# Patient Record
Sex: Female | Born: 1955 | Race: White | Hispanic: No | Marital: Married | Smoking: Former smoker
Health system: Southern US, Community
[De-identification: ages and names within clinical notes are randomized; demographics above are authoritative.]

## PROBLEM LIST (undated history)

## (undated) DIAGNOSIS — K219 Gastro-esophageal reflux disease without esophagitis: Secondary | ICD-10-CM

## (undated) DIAGNOSIS — R42 Dizziness and giddiness: Secondary | ICD-10-CM

## (undated) DIAGNOSIS — G43909 Migraine, unspecified, not intractable, without status migrainosus: Secondary | ICD-10-CM

## (undated) DIAGNOSIS — K5792 Diverticulitis of intestine, part unspecified, without perforation or abscess without bleeding: Secondary | ICD-10-CM

## (undated) HISTORY — DX: Dizziness and giddiness: R42

## (undated) HISTORY — DX: Gastro-esophageal reflux disease without esophagitis: K21.9

## (undated) HISTORY — PX: TUBAL LIGATION: SHX77

## (undated) HISTORY — PX: COLONOSCOPY: SHX174

## (undated) HISTORY — DX: Migraine, unspecified, not intractable, without status migrainosus: G43.909

## (undated) HISTORY — DX: Diverticulitis of intestine, part unspecified, without perforation or abscess without bleeding: K57.92

---

## 2003-08-27 LAB — HM PAP SMEAR

## 2003-08-28 LAB — HM MAMMOGRAPHY: HM Mammogram: NORMAL (ref 0–4)

## 2006-03-28 ENCOUNTER — Ambulatory Visit: Payer: Self-pay | Admitting: Gastroenterology

## 2006-05-15 ENCOUNTER — Ambulatory Visit: Payer: Self-pay | Admitting: Gastroenterology

## 2006-05-15 LAB — HM COLONOSCOPY

## 2007-05-05 IMAGING — CT CT ABD-PELV W/ CM
1 of 2 series · 15 of 32 positions shown, 19 images · non-contrast
Comparison: none

REASON FOR EXAM: LLQ pain abd pain  eval diverticulitis
COMMENTS:

[Series 2: abdomen · axial · 0.76mm/px · z∈[-82,+326]mm · 15 of 57 slices shown, 19 images]
[im 3/57  soft-tissue]
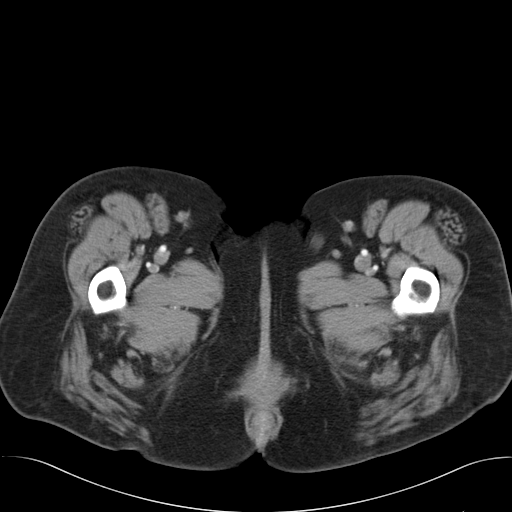
[im 3/57  bone]
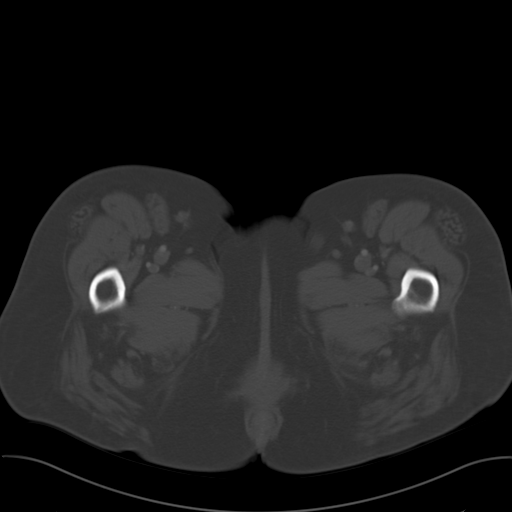
[im 8/57  soft-tissue]
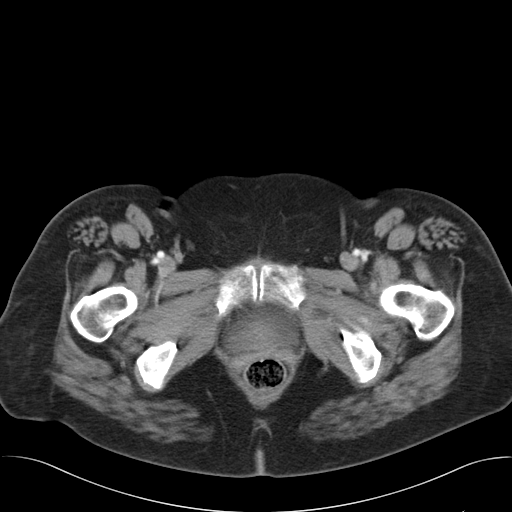
[im 12/57  soft-tissue]
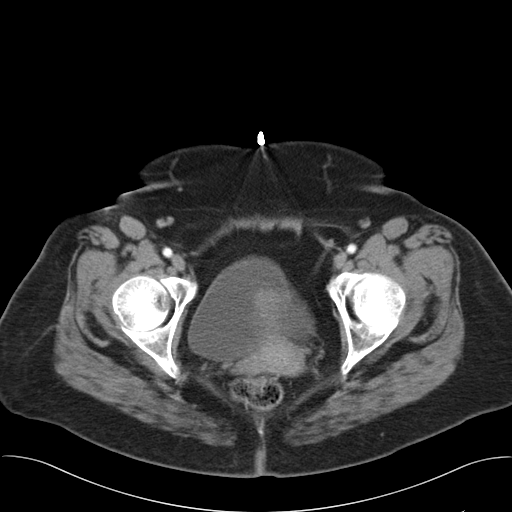
[im 17/57  soft-tissue]
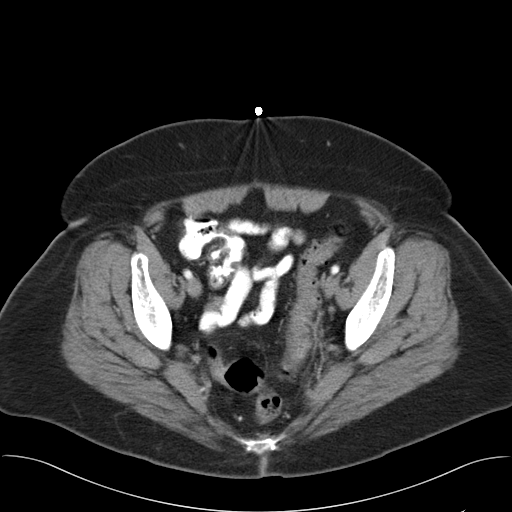
[im 19/57  soft-tissue]
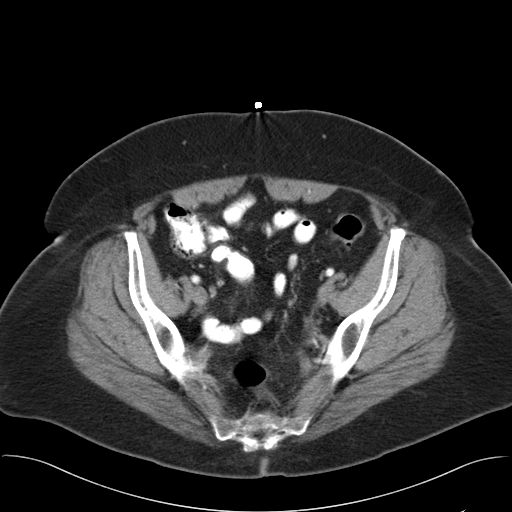
[im 24/57  soft-tissue]
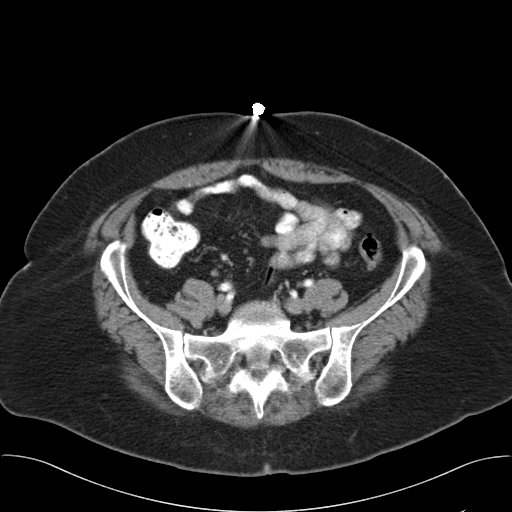
[im 29/57  soft-tissue]
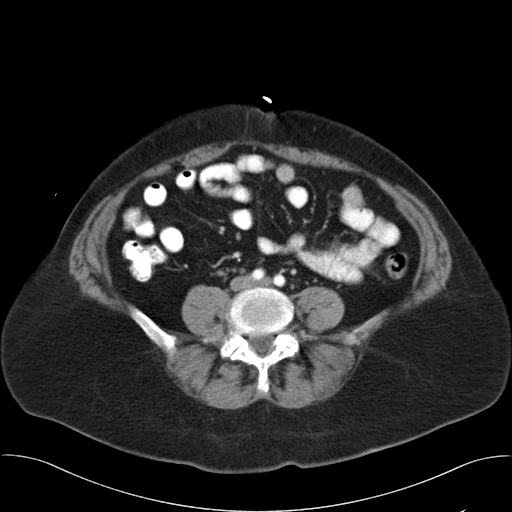
[im 33/57  soft-tissue]
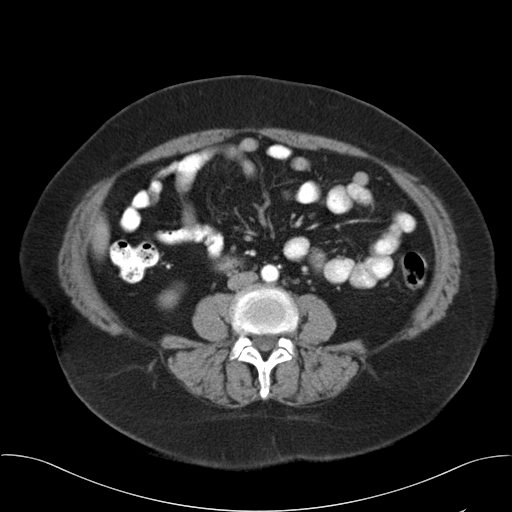
[im 38/57  soft-tissue]
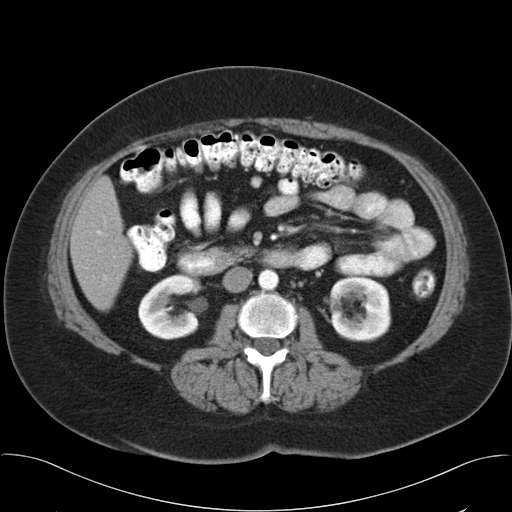
[im 38/57  bone]
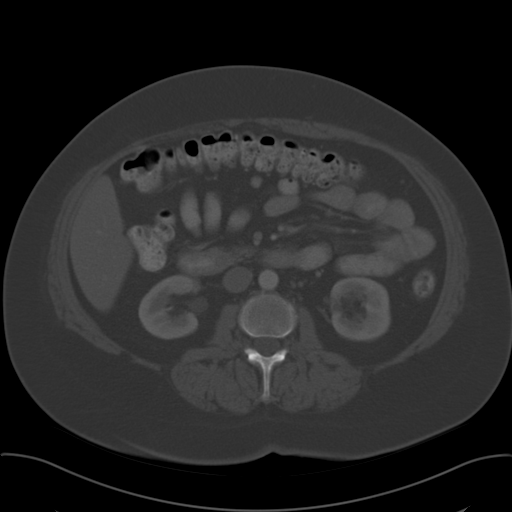
[im 40/57  soft-tissue]
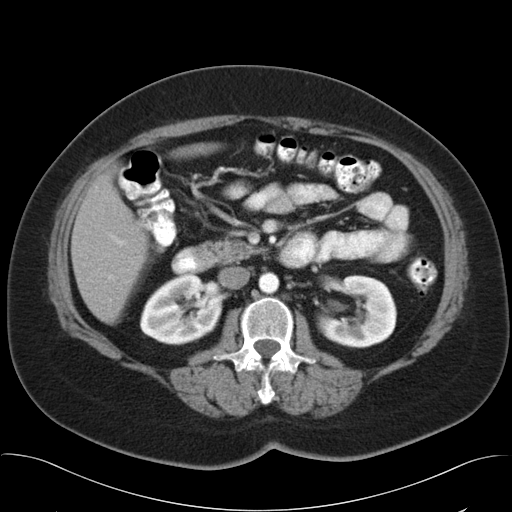
[im 45/57  soft-tissue]
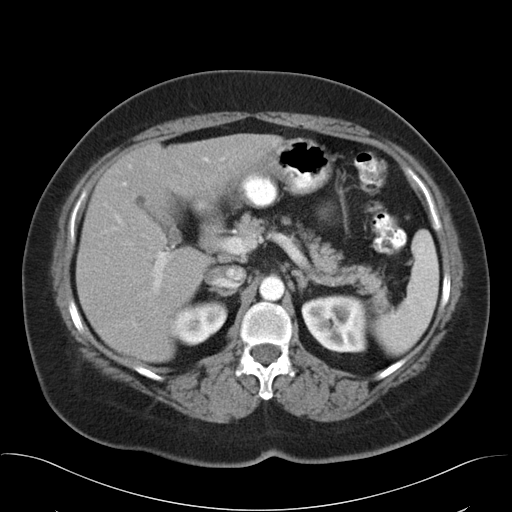
[im 47/57  lung]
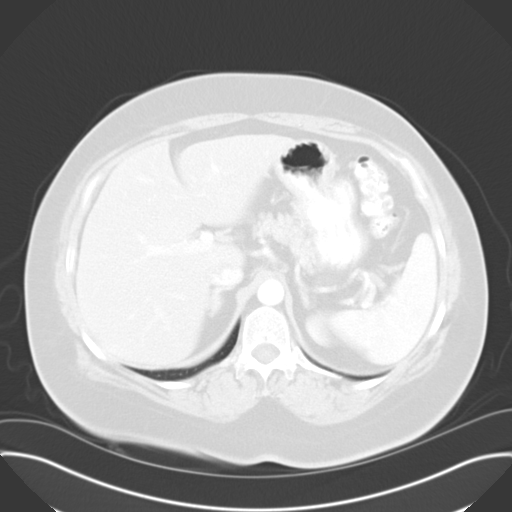
[im 50/57  soft-tissue]
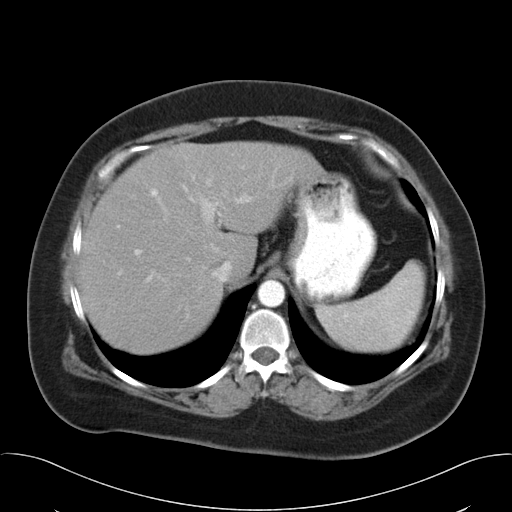
[im 50/57  lung]
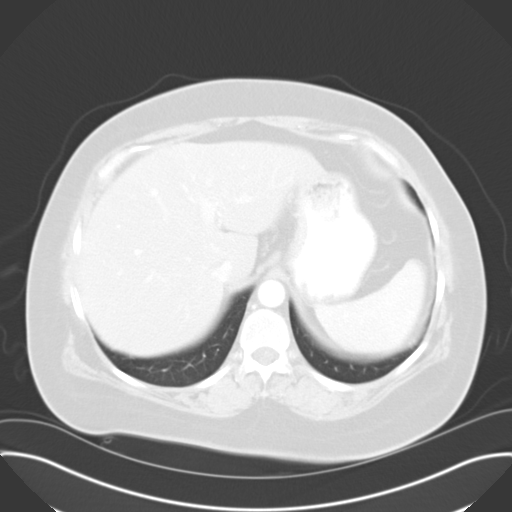
[im 52/57  lung]
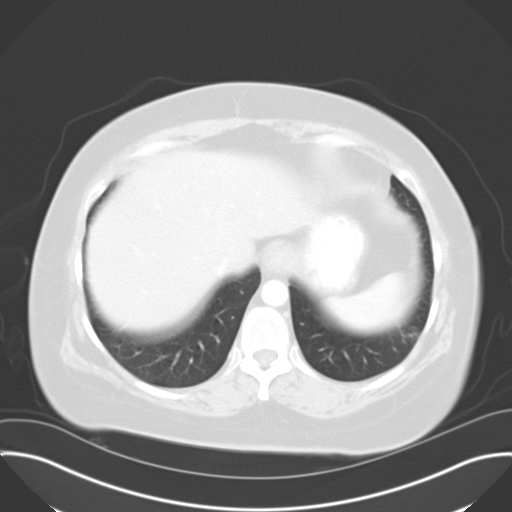
[im 54/57  soft-tissue]
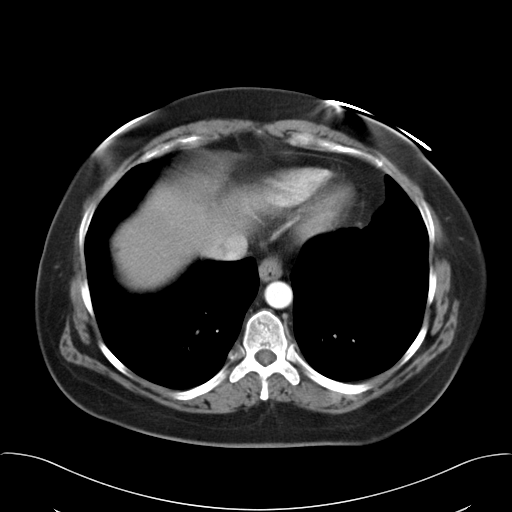
[im 54/57  lung]
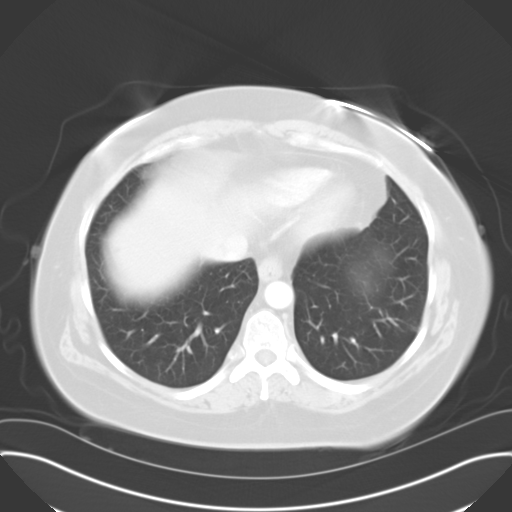

[15 of 32 positions shown; findings below may reference images not displayed]

PROCEDURE:     CT  - CT ABDOMEN / PELVIS  W  - March 28, 2006  [DATE]

RESULT:       IV and oral contrast enhanced CT scan of the Abdomen and
Pelvis is performed in the standard fashion.  The patient has no prior
studies available for comparison.  The patient is experiencing LEFT lower
quadrant pain.  There are some scattered diverticula seen especially in the
area of the junction of the sigmoid and descending colonic structures.
Discrete abscess formation is not evident.  The possibility of some very
mild diverticulitis cannot be excluded.  Again, there is no evidence of
abscess or free air.  No abnormal bowel distention is seen.  There is no
ascites.  The liver, spleen, gallbladder, pancreas, kidneys and adrenal
glands appear to be unremarkable.  The abdominal aorta is unremarkable.
IMPRESSION: CT findings of diverticulosis.  Very mild diverticulitis cannot be
completely excluded as there is very minimal stranding about the sigmoid
colonic region.  Clinical and laboratory correlation and follow up is
recommended.  The uterus is present.

## 2016-05-23 DIAGNOSIS — G43909 Migraine, unspecified, not intractable, without status migrainosus: Secondary | ICD-10-CM | POA: Insufficient documentation

## 2016-07-25 ENCOUNTER — Encounter: Payer: Self-pay | Admitting: Unknown Physician Specialty

## 2016-07-25 ENCOUNTER — Ambulatory Visit (INDEPENDENT_AMBULATORY_CARE_PROVIDER_SITE_OTHER): Payer: BLUE CROSS/BLUE SHIELD | Admitting: Unknown Physician Specialty

## 2016-07-25 VITALS — BP 117/80 | HR 78 | Temp 97.6°F | Ht 63.3 in | Wt 180.4 lb

## 2016-07-25 DIAGNOSIS — E669 Obesity, unspecified: Secondary | ICD-10-CM | POA: Diagnosis not present

## 2016-07-25 DIAGNOSIS — Z0000 Encounter for general adult medical examination without abnormal findings: Secondary | ICD-10-CM

## 2016-07-25 DIAGNOSIS — F17200 Nicotine dependence, unspecified, uncomplicated: Secondary | ICD-10-CM | POA: Diagnosis not present

## 2016-07-25 NOTE — Assessment & Plan Note (Addendum)
Pt is not interested in quitting at this time.  Recommended low dose CT

## 2016-07-25 NOTE — Progress Notes (Signed)
BP 117/80 (BP Location: Left Arm, Patient Position: Sitting, Cuff Size: Large)   Pulse 78   Temp 97.6 F (36.4 C)   Ht 5' 3.3" (1.608 m)   Wt 180 lb 6.4 oz (81.8 kg)   LMP  (LMP Unknown)   SpO2 97%   BMI 31.65 kg/m    Subjective:    Patient ID: Amy Daniel, female    DOB: 05/31/1955, 61 y.o.   MRN: [id number]  HPI: Amy Daniel is a 61 y.o. female  Chief Complaint  Patient presents with  . Establish Care  . Annual Exam   Depression screen [facility] 2/9 07/25/2016  Decreased Interest 0  Down, Depressed, Hopeless 0  PHQ - 2 Score 0  Altered sleeping 0  Tired, decreased energy 0  Change in appetite 0  Feeling bad or failure about yourself  0  Trouble concentrating 0  Moving slowly or fidgety/restless 0  Suicidal thoughts 0  PHQ-9 Score 0    Social History   Social History  . Marital status: Married    Spouse name: N/A  . Number of children: N/A  . Years of education: N/A   Occupational History  . Not on file.   Social History Main Topics  . Smoking status: Current Every Day Smoker    Packs/day: 1.00  . Smokeless tobacco: Never Used  . Alcohol use No  . Drug use: No  . Sexual activity: Yes   Other Topics Concern  . Not on file   Social History Narrative  . No narrative on file   Family History  Problem Relation Age of Onset  . Cancer Mother     breast  . AAA (abdominal aortic aneurysm) Father   . Diabetes Brother   . Heart attack Maternal Grandfather   . Emphysema Maternal Grandfather    Past Medical History:  Diagnosis Date  . Acid reflux   . Diverticulitis   . Migraines   . Vertigo    Past Surgical History:  Procedure Laterality Date  . TUBAL LIGATION     Lifestyle  - Doing line dancing.   States she is starting to work on diet and does not eat after 7p.    Relevant past medical, surgical, family and social history reviewed and updated as indicated. Interim medical history since our last visit reviewed. Allergies and  medications reviewed and updated.  Review of Systems  Constitutional: Negative.   HENT: Negative.   Eyes: Negative.   Respiratory: Negative.   Cardiovascular: Negative.   Gastrointestinal: Negative.   Endocrine: Negative.   Genitourinary: Negative.   Musculoskeletal: Negative.   Skin: Negative.   Allergic/Immunologic: Negative.   Neurological: Negative.   Hematological: Negative.   Psychiatric/Behavioral: Negative.     Per HPI unless specifically indicated above     Objective:    BP 117/80 (BP Location: Left Arm, Patient Position: Sitting, Cuff Size: Large)   Pulse 78   Temp 97.6 F (36.4 C)   Ht 5' 3.3" (1.608 m)   Wt 180 lb 6.4 oz (81.8 kg)   LMP  (LMP Unknown)   SpO2 97%   BMI 31.65 kg/m   Wt Readings from Last 3 Encounters:  07/25/16 180 lb 6.4 oz (81.8 kg)    Physical Exam  Constitutional: She is oriented to person, place, and time. She appears well-developed and well-nourished.  HENT:  Head: Normocephalic and atraumatic.  Eyes: Pupils are equal, round, and reactive to light. Right eye exhibits no discharge. Left eye  exhibits no discharge. No scleral icterus.  Neck: Normal range of motion. Neck supple. Carotid bruit is not present. No thyromegaly present.  Cardiovascular: Normal rate, regular rhythm and normal heart sounds.  Exam reveals no gallop and no friction rub.   No murmur heard. Pulmonary/Chest: Effort normal and breath sounds normal. No respiratory distress. She has no wheezes. She has no rales.  Abdominal: Soft. Bowel sounds are normal. There is no tenderness. There is no rebound.  Genitourinary: Vagina normal and uterus normal. No breast swelling, tenderness or discharge. Cervix exhibits no motion tenderness, no discharge and no friability. Right adnexum displays no mass, no tenderness and no fullness. Left adnexum displays no mass, no tenderness and no fullness.  Musculoskeletal: Normal range of motion.  Lymphadenopathy:    She has no cervical  adenopathy.  Neurological: She is alert and oriented to person, place, and time.  Skin: Skin is warm, dry and intact. No rash noted.  Psychiatric: She has a normal mood and affect. Her speech is normal and behavior is normal. Judgment and thought content normal. Cognition and memory are normal.    Results for orders placed or performed in visit on 05/23/16  HM MAMMOGRAPHY  Result Value Ref Range   HM Mammogram Self Reported Normal 0-4 Bi-Rad, Self Reported Normal  HM PAP SMEAR  Result Value Ref Range   HM Pap smear per PP   HM COLONOSCOPY  Result Value Ref Range   HM Colonoscopy Patient Reported See Report (in chart), Patient Reported      Assessment & Plan:   Problem List Items Addressed This Visit      Unprioritized   Obesity (BMI 30.0-34.9)    Working on diet and exercise and probably lost about 12 pounds      Smoking    Pt is not interested in quitting at this time.  Recommended low dose CT       Other Visit Diagnoses    Annual physical exam    -  Primary   Relevant Orders   CBC with Differential/Platelet   Comprehensive metabolic panel   IGP, Aptima HPV, rfx 16/18,45   Lipid Panel w/o Chol/HDL Ratio   TSH   MM DIGITAL SCREENING BILATERAL       Follow up plan: Return in about 1 year (around 07/25/2017).

## 2016-07-25 NOTE — Patient Instructions (Signed)
Please do call to schedule your mammogram; the number to schedule one at either [facility] or [facility] is [phone]   

## 2016-07-25 NOTE — Assessment & Plan Note (Signed)
Working on diet and exercise and probably lost about 12 pounds

## 2016-07-26 ENCOUNTER — Encounter: Payer: Self-pay | Admitting: Unknown Physician Specialty

## 2016-07-26 LAB — CBC WITH DIFFERENTIAL/PLATELET
BASOS: 0 %
Basophils Absolute: 0 10*3/uL (ref 0.0–0.2)
EOS (ABSOLUTE): 0.1 10*3/uL (ref 0.0–0.4)
Eos: 1 %
HEMATOCRIT: 42.3 % (ref 34.0–46.6)
Hemoglobin: 14.1 g/dL (ref 11.1–15.9)
IMMATURE GRANULOCYTES: 0 %
Immature Grans (Abs): 0 10*3/uL (ref 0.0–0.1)
Lymphocytes Absolute: 2.3 10*3/uL (ref 0.7–3.1)
Lymphs: 31 %
MCH: 32.6 pg (ref 26.6–33.0)
MCHC: 33.3 g/dL (ref 31.5–35.7)
MCV: 98 fL — AB (ref 79–97)
MONOS ABS: 0.3 10*3/uL (ref 0.1–0.9)
Monocytes: 4 %
NEUTROS ABS: 4.6 10*3/uL (ref 1.4–7.0)
NEUTROS PCT: 64 %
Platelets: 216 10*3/uL (ref 150–379)
RBC: 4.33 x10E6/uL (ref 3.77–5.28)
RDW: 13.7 % (ref 12.3–15.4)
WBC: 7.3 10*3/uL (ref 3.4–10.8)

## 2016-07-26 LAB — LIPID PANEL W/O CHOL/HDL RATIO
CHOLESTEROL TOTAL: 198 mg/dL (ref 100–199)
HDL: 39 mg/dL — AB (ref >39)
LDL CALC: 132 mg/dL — AB (ref 0–99)
TRIGLYCERIDES: 135 mg/dL (ref 0–149)
VLDL Cholesterol Cal: 27 mg/dL (ref 5–40)

## 2016-07-26 LAB — COMPREHENSIVE METABOLIC PANEL
A/G RATIO: 1.4 (ref 1.2–2.2)
ALT: 19 IU/L (ref 0–32)
AST: 19 IU/L (ref 0–40)
Albumin: 4.3 g/dL (ref 3.5–5.5)
Alkaline Phosphatase: 70 IU/L (ref 39–117)
BUN / CREAT RATIO: 11 (ref 9–23)
BUN: 8 mg/dL (ref 6–24)
Bilirubin Total: 0.2 mg/dL (ref 0.0–1.2)
CALCIUM: 9.3 mg/dL (ref 8.7–10.2)
CO2: 28 mmol/L (ref 18–29)
Chloride: 103 mmol/L (ref 96–106)
Creatinine, Ser: 0.75 mg/dL (ref 0.57–1.00)
GFR, EST AFRICAN AMERICAN: 101 mL/min/{1.73_m2} (ref >59)
GFR, EST NON AFRICAN AMERICAN: 88 mL/min/{1.73_m2} (ref >59)
Globulin, Total: 3 g/dL (ref 1.5–4.5)
Glucose: 102 mg/dL — ABNORMAL HIGH (ref 65–99)
POTASSIUM: 4.2 mmol/L (ref 3.5–5.2)
Sodium: 143 mmol/L (ref 134–144)
Total Protein: 7.3 g/dL (ref 6.0–8.5)

## 2016-07-26 LAB — TSH: TSH: 0.736 u[IU]/mL (ref 0.450–4.500)

## 2016-07-27 ENCOUNTER — Telehealth: Payer: Self-pay | Admitting: *Deleted

## 2016-07-27 NOTE — Telephone Encounter (Signed)
Received referral for low dose lung cancer screening CT scan. Patient desires to not have scan at this time. My contact number given to patient as well as encouraging screening when she is ready. Also given information related to financial assistance available for screening if needed.

## 2016-07-28 LAB — IGP, APTIMA HPV, RFX 16/18,45
HPV APTIMA: NEGATIVE
PAP SMEAR COMMENT: 0

## 2016-09-01 ENCOUNTER — Encounter: Payer: Self-pay | Admitting: *Deleted

## 2017-03-29 ENCOUNTER — Encounter: Payer: Self-pay | Admitting: Unknown Physician Specialty

## 2017-03-29 ENCOUNTER — Ambulatory Visit (INDEPENDENT_AMBULATORY_CARE_PROVIDER_SITE_OTHER): Payer: BLUE CROSS/BLUE SHIELD | Admitting: Unknown Physician Specialty

## 2017-03-29 VITALS — BP 138/84 | HR 61 | Temp 97.7°F | Wt 175.2 lb

## 2017-03-29 DIAGNOSIS — R42 Dizziness and giddiness: Secondary | ICD-10-CM | POA: Diagnosis not present

## 2017-03-29 MED ORDER — MECLIZINE HCL 25 MG PO TABS: 25.0000 mg | ORAL_TABLET | Freq: Three times a day (TID) | ORAL | 0 refills | Status: DC | PRN

## 2017-03-29 NOTE — Progress Notes (Signed)
BP 138/84   Pulse 61   Temp 97.7 F (36.5 C) (Oral)   Wt 175 lb 3.2 oz (79.5 kg)   SpO2 98%   BMI 30.74 kg/m    Subjective:    Patient ID: Amy FleetSusan L Pawling, female    DOB: April 29, 1955, 61 y.o.   MRN: [id number]  HPI: Amy FleetSusan L Rudnicki is a 61 y.o. female  Chief Complaint  Patient presents with  . Dizziness    pt states she has had vertigo on and off for years but has been having it every day since Saturday. States she is fine sitting down but gets dizzy when she stands   Dizziness  This is a new problem. Episode onset: 5 days. Episode frequency: Whenever she stands. The problem has been unchanged. Associated symptoms include vertigo. Pertinent negatives include no abdominal pain, chest pain, chills, congestion, coughing, diaphoresis, fatigue, fever, headaches, myalgias, numbness, rash or weakness. Exacerbated by: standing. Treatments tried: Motion sickness pills made her tired.  Not much help with Meclizine.   Lifetime of vertigo that comes and goes but never lasted this long  Relevant past medical, surgical, family and social history reviewed and updated as indicated. Interim medical history since our last visit reviewed. Allergies and medications reviewed and updated.  Review of Systems  Constitutional: Negative for chills, diaphoresis, fatigue and fever.  HENT: Negative for congestion.   Respiratory: Negative for cough.   Cardiovascular: Negative for chest pain.  Gastrointestinal: Negative for abdominal pain.  Musculoskeletal: Negative for myalgias.  Skin: Negative for rash.  Neurological: Positive for dizziness and vertigo. Negative for weakness, numbness and headaches.    Per HPI unless specifically indicated above     Objective:    BP 138/84   Pulse 61   Temp 97.7 F (36.5 C) (Oral)   Wt 175 lb 3.2 oz (79.5 kg)   SpO2 98%   BMI 30.74 kg/m   Wt Readings from Last 3 Encounters:  03/29/17 175 lb 3.2 oz (79.5 kg)  07/25/16 180 lb 6.4 oz (81.8 kg)      Physical Exam  Constitutional: She is oriented to person, place, and time. She appears well-developed and well-nourished. No distress.  HENT:  Head: Normocephalic and atraumatic.  Right Ear: External ear normal.  Left Ear: External ear normal.  Eyes: Conjunctivae and lids are normal. Right eye exhibits no discharge. Left eye exhibits no discharge. No scleral icterus.  Neck: Normal range of motion. Neck supple. No JVD present. Carotid bruit is not present.  Cardiovascular: Normal rate, regular rhythm and normal heart sounds.  Pulmonary/Chest: Effort normal and breath sounds normal.  Abdominal: Normal appearance. There is no splenomegaly or hepatomegaly.  Musculoskeletal: Normal range of motion.  Neurological: She is alert and oriented to person, place, and time.  Skin: Skin is warm, dry and intact. No rash noted. No pallor.  Psychiatric: She has a normal mood and affect. Her behavior is normal. Judgment and thought content normal.    Results for orders placed or performed in visit on 07/25/16  CBC with Differential/Platelet  Result Value Ref Range   WBC 7.3 3.4 - 10.8 x10E3/uL   RBC 4.33 3.77 - 5.28 x10E6/uL   Hemoglobin 14.1 11.1 - 15.9 g/dL   Hematocrit 14.742.3 82.934.0 - 46.6 %   MCV 98 (H) 79 - 97 fL   MCH 32.6 26.6 - 33.0 pg   MCHC 33.3 31.5 - 35.7 g/dL   RDW 56.213.7 13.012.3 - 86.515.4 %   Platelets 216 150 -  379 x10E3/uL   Neutrophils 64 Not Estab. %   Lymphs 31 Not Estab. %   Monocytes 4 Not Estab. %   Eos 1 Not Estab. %   Basos 0 Not Estab. %   Neutrophils Absolute 4.6 1.4 - 7.0 x10E3/uL   Lymphocytes Absolute 2.3 0.7 - 3.1 x10E3/uL   Monocytes Absolute 0.3 0.1 - 0.9 x10E3/uL   EOS (ABSOLUTE) 0.1 0.0 - 0.4 x10E3/uL   Basophils Absolute 0.0 0.0 - 0.2 x10E3/uL   Immature Granulocytes 0 Not Estab. %   Immature Grans (Abs) 0.0 0.0 - 0.1 x10E3/uL  Comprehensive metabolic panel  Result Value Ref Range   Glucose 102 (H) 65 - 99 mg/dL   BUN 8 6 - 24 mg/dL   Creatinine, Ser 1.610.75 0.57 -  1.00 mg/dL   GFR calc non Af Amer 88 >59 mL/min/1.73   GFR calc Af Amer 101 >59 mL/min/1.73   BUN/Creatinine Ratio 11 9 - 23   Sodium 143 134 - 144 mmol/L   Potassium 4.2 3.5 - 5.2 mmol/L   Chloride 103 96 - 106 mmol/L   CO2 28 18 - 29 mmol/L   Calcium 9.3 8.7 - 10.2 mg/dL   Total Protein 7.3 6.0 - 8.5 g/dL   Albumin 4.3 3.5 - 5.5 g/dL   Globulin, Total 3.0 1.5 - 4.5 g/dL   Albumin/Globulin Ratio 1.4 1.2 - 2.2   Bilirubin Total 0.2 0.0 - 1.2 mg/dL   Alkaline Phosphatase 70 39 - 117 IU/L   AST 19 0 - 40 IU/L   ALT 19 0 - 32 IU/L  Lipid Panel w/o Chol/HDL Ratio  Result Value Ref Range   Cholesterol, Total 198 100 - 199 mg/dL   Triglycerides 096135 0 - 149 mg/dL   HDL 39 (L) >04>39 mg/dL   VLDL Cholesterol Cal 27 5 - 40 mg/dL   LDL Calculated 540132 (H) 0 - 99 mg/dL  TSH  Result Value Ref Range   TSH 0.736 0.450 - 4.500 uIU/mL  IGP, Aptima HPV, rfx 16/18,45  Result Value Ref Range   DIAGNOSIS: Comment    Specimen adequacy: Comment    Clinician Provided ICD10 Comment    Performed by: Comment    QC reviewed by: Comment    PAP Smear Comment .    Note: Comment    Test Methodology Comment    HPV Aptima Negative Negative      Assessment & Plan:   Problem List Items Addressed This Visit    None    Visit Diagnoses    Vertigo    -  Primary   Discussed "do it yourself" Epley maneuvers.  Fluids.  Meclizine prn       Follow up plan: Return if symptoms worsen or fail to improve.

## 2017-03-29 NOTE — Patient Instructions (Signed)
Look up "do it yourself" Epley maneuvers or vestibular exercises

## 2017-05-29 ENCOUNTER — Encounter: Payer: Self-pay | Admitting: Unknown Physician Specialty

## 2017-07-28 ENCOUNTER — Encounter: Payer: BLUE CROSS/BLUE SHIELD | Admitting: Unknown Physician Specialty

## 2017-07-31 ENCOUNTER — Encounter: Payer: BLUE CROSS/BLUE SHIELD | Admitting: Unknown Physician Specialty

## 2019-01-08 ENCOUNTER — Other Ambulatory Visit: Payer: Self-pay

## 2019-01-08 ENCOUNTER — Encounter: Payer: Self-pay | Admitting: *Deleted

## 2019-01-10 ENCOUNTER — Other Ambulatory Visit: Payer: Self-pay

## 2019-01-10 ENCOUNTER — Other Ambulatory Visit
Admission: RE | Admit: 2019-01-10 | Discharge: 2019-01-10 | Disposition: A | Discharge status: Home / Self Care | Payer: BLUE CROSS/BLUE SHIELD | Source: Ambulatory Visit | Attending: Ophthalmology | Admitting: Ophthalmology

## 2019-01-10 DIAGNOSIS — Z01812 Encounter for preprocedural laboratory examination: Secondary | ICD-10-CM | POA: Diagnosis not present

## 2019-01-10 DIAGNOSIS — Z20828 Contact with and (suspected) exposure to other viral communicable diseases: Secondary | ICD-10-CM | POA: Diagnosis not present

## 2019-01-10 DIAGNOSIS — H2511 Age-related nuclear cataract, right eye: Secondary | ICD-10-CM | POA: Diagnosis not present

## 2019-01-10 NOTE — Discharge Instructions (Signed)

## 2019-01-11 LAB — SARS CORONAVIRUS 2 (TAT 6-24 HRS): SARS Coronavirus 2: NEGATIVE

## 2019-01-14 ENCOUNTER — Ambulatory Visit: Payer: BLUE CROSS/BLUE SHIELD | Admitting: Anesthesiology

## 2019-01-14 ENCOUNTER — Ambulatory Visit
Admission: RE | Admit: 2019-01-14 | Discharge: 2019-01-14 | Disposition: A | Discharge status: Home / Self Care | Payer: BLUE CROSS/BLUE SHIELD | Attending: Ophthalmology | Admitting: Ophthalmology

## 2019-01-14 ENCOUNTER — Other Ambulatory Visit: Payer: Self-pay

## 2019-01-14 ENCOUNTER — Encounter
Admission: RE | Disposition: A | Discharge status: Home / Self Care | Payer: Self-pay | Source: Home / Self Care | Attending: Ophthalmology

## 2019-01-14 DIAGNOSIS — K219 Gastro-esophageal reflux disease without esophagitis: Secondary | ICD-10-CM | POA: Diagnosis not present

## 2019-01-14 DIAGNOSIS — H2511 Age-related nuclear cataract, right eye: Secondary | ICD-10-CM | POA: Insufficient documentation

## 2019-01-14 DIAGNOSIS — F17200 Nicotine dependence, unspecified, uncomplicated: Secondary | ICD-10-CM | POA: Diagnosis not present

## 2019-01-14 DIAGNOSIS — Z79899 Other long term (current) drug therapy: Secondary | ICD-10-CM | POA: Diagnosis not present

## 2019-01-14 HISTORY — PX: CATARACT EXTRACTION W/PHACO: SHX586

## 2019-01-14 SURGERY — PHACOEMULSIFICATION, CATARACT, WITH IOL INSERTION
Anesthesia: Monitor Anesthesia Care | Site: Eye | Laterality: Right

## 2019-01-14 MED ORDER — ONDANSETRON HCL 4 MG/2ML IJ SOLN
4.0000 mg | Freq: Once | INTRAMUSCULAR | Status: AC
  Administered 2019-01-14: 4 mg via INTRAVENOUS

## 2019-01-14 MED ORDER — SODIUM HYALURONATE 23 MG/ML IO SOLN
INTRAOCULAR | Status: DC | PRN
  Administered 2019-01-14: 0.6 mL via INTRAOCULAR

## 2019-01-14 MED ORDER — ACETAMINOPHEN 160 MG/5ML PO SOLN: 325.0000 mg | Freq: Once | ORAL | Status: DC

## 2019-01-14 MED ORDER — LIDOCAINE HCL (PF) 2 % IJ SOLN
INTRAOCULAR | Status: DC | PRN
  Administered 2019-01-14: 1 mL via INTRAOCULAR

## 2019-01-14 MED ORDER — TETRACAINE HCL 0.5 % OP SOLN
1.0000 [drp] | OPHTHALMIC | Status: DC | PRN
  Administered 2019-01-14 (×3): 1 [drp] via OPHTHALMIC

## 2019-01-14 MED ORDER — FENTANYL CITRATE (PF) 100 MCG/2ML IJ SOLN
INTRAMUSCULAR | Status: DC | PRN
  Administered 2019-01-14: 100 ug via INTRAVENOUS

## 2019-01-14 MED ORDER — MOXIFLOXACIN HCL 0.5 % OP SOLN
OPHTHALMIC | Status: DC | PRN
  Administered 2019-01-14: 0.2 mL via OPHTHALMIC

## 2019-01-14 MED ORDER — ARMC OPHTHALMIC DILATING DROPS
1.0000 "application " | OPHTHALMIC | Status: DC | PRN
  Administered 2019-01-14 (×3): 1 via OPHTHALMIC

## 2019-01-14 MED ORDER — MIDAZOLAM HCL 2 MG/2ML IJ SOLN
INTRAMUSCULAR | Status: DC | PRN
  Administered 2019-01-14: 2 mg via INTRAVENOUS

## 2019-01-14 MED ORDER — SODIUM HYALURONATE 10 MG/ML IO SOLN
INTRAOCULAR | Status: DC | PRN
  Administered 2019-01-14: 0.55 mL via INTRAOCULAR

## 2019-01-14 MED ORDER — EPINEPHRINE PF 1 MG/ML IJ SOLN
INTRAOCULAR | Status: DC | PRN
  Administered 2019-01-14: 13:00:00 62 mL via OPHTHALMIC

## 2019-01-14 MED ORDER — ACETAMINOPHEN 325 MG PO TABS: 325.0000 mg | ORAL_TABLET | Freq: Once | ORAL | Status: DC

## 2019-01-14 MED ORDER — LACTATED RINGERS IV SOLN: INTRAVENOUS | Status: DC

## 2019-01-14 SURGICAL SUPPLY — 19 items
CANNULA ANT/CHMB 27G (MISCELLANEOUS) ×2 IMPLANT
CANNULA ANT/CHMB 27GA (MISCELLANEOUS) ×4 IMPLANT
DISSECTOR HYDRO NUCLEUS 50X22 (MISCELLANEOUS) ×2 IMPLANT
GLOVE SURG LX 7.5 STRW (GLOVE) ×1
GLOVE SURG LX STRL 7.5 STRW (GLOVE) ×1 IMPLANT
GLOVE SURG SYN 8.5  E (GLOVE) ×1
GLOVE SURG SYN 8.5 E (GLOVE) ×1 IMPLANT
GLOVE SURG SYN 8.5 PF PI (GLOVE) ×1 IMPLANT
GOWN STRL REUS W/ TWL LRG LVL3 (GOWN DISPOSABLE) ×2 IMPLANT
GOWN STRL REUS W/TWL LRG LVL3 (GOWN DISPOSABLE) ×2
LENS IOL TECNIS ITEC 21.5 (Intraocular Lens) ×1 IMPLANT
MARKER SKIN DUAL TIP RULER LAB (MISCELLANEOUS) ×2 IMPLANT
PACK DR. KING ARMS (PACKS) ×2 IMPLANT
PACK EYE AFTER SURG (MISCELLANEOUS) ×2 IMPLANT
PACK OPTHALMIC (MISCELLANEOUS) ×2 IMPLANT
SYR 3ML LL SCALE MARK (SYRINGE) ×2 IMPLANT
SYR TB 1ML LUER SLIP (SYRINGE) ×2 IMPLANT
WATER STERILE IRR 250ML POUR (IV SOLUTION) ×2 IMPLANT
WIPE NON LINTING 3.25X3.25 (MISCELLANEOUS) ×2 IMPLANT

## 2019-01-14 NOTE — Anesthesia Preprocedure Evaluation (Signed)
Anesthesia Evaluation  Patient identified by MRN, date of birth, ID band Patient awake    Reviewed: Allergy & Precautions, H&P , NPO status , Patient's Chart, lab work & pertinent test results  Airway Mallampati: II  TM Distance: >3 FB Neck ROM: full    Dental no notable dental hx.    Pulmonary Current SmokerPatient did not abstain from smoking.,    Pulmonary exam normal breath sounds clear to auscultation       Cardiovascular Normal cardiovascular exam Rhythm:regular Rate:Normal     Neuro/Psych  Headaches,    GI/Hepatic GERD  ,  Endo/Other    Renal/GU      Musculoskeletal   Abdominal   Peds  Hematology   Anesthesia Other Findings   Reproductive/Obstetrics                             Anesthesia Physical Anesthesia Plan  ASA: II  Anesthesia Plan: MAC   Post-op Pain Management:    Induction:   PONV Risk Score and Plan: 2 and Midazolam, Treatment may vary due to age or medical condition and TIVA  Airway Management Planned:   Additional Equipment:   Intra-op Plan:   Post-operative Plan:   Informed Consent: I have reviewed the patients History and Physical, chart, labs and discussed the procedure including the risks, benefits and alternatives for the proposed anesthesia with the patient or authorized representative who has indicated his/her understanding and acceptance.       Plan Discussed with: CRNA  Anesthesia Plan Comments:         Anesthesia Quick Evaluation

## 2019-01-14 NOTE — Transfer of Care (Signed)
Immediate Anesthesia Transfer of Care Note  Patient: Amy Daniel  Procedure(s) Performed: CATARACT EXTRACTION PHACO AND INTRAOCULAR LENS PLACEMENT (IOC) RIGHT  00:52.0  9.19  17.5 (Right Eye)  Patient Location: PACU  Anesthesia Type: MAC  Level of Consciousness: awake, alert  and patient cooperative  Airway and Oxygen Therapy: Patient Spontanous Breathing and Patient connected to supplemental oxygen  Post-op Assessment: Post-op Vital signs reviewed, Patient's Cardiovascular Status Stable, Respiratory Function Stable, Patent Airway and No signs of Nausea or vomiting  Post-op Vital Signs: Reviewed and stable  Complications: No apparent anesthesia complications

## 2019-01-14 NOTE — H&P (Signed)

## 2019-01-14 NOTE — Op Note (Signed)
OPERATIVE NOTE  Amy Daniel [id number] 01/14/2019   PREOPERATIVE DIAGNOSIS:  Nuclear sclerotic cataract right eye.  H25.11   POSTOPERATIVE DIAGNOSIS:    Nuclear sclerotic cataract right eye.     PROCEDURE:  Phacoemusification with posterior chamber intraocular lens placement of the right eye   LENS:   Implant Name Type Inv. Item Serial No. Manufacturer Lot No. LRB No. Used Action  LENS IOL DIOP 21.5 - [id number] Intraocular Lens LENS IOL DIOP 21.5 [id number] AMO  Right 1 Implanted       Procedure(s): CATARACT EXTRACTION PHACO AND INTRAOCULAR LENS PLACEMENT (IOC) RIGHT (Right)     ULTRASOUND TIME: 0 minutes 52 seconds.  CDE 9.19   SURGEON:  Benay Pillow, MD, MPH  ANESTHESIOLOGIST: Anesthesiologist: Ronelle Nigh, MD CRNA: Silvana Newness, CRNA   ANESTHESIA:  Topical with tetracaine drops augmented with 1% preservative-free intracameral lidocaine.  ESTIMATED BLOOD LOSS: less than 1 mL.   COMPLICATIONS:  None.   DESCRIPTION OF PROCEDURE:  The patient was identified in the holding room and transported to the operating room and placed in the supine position under the operating microscope.  The right eye was identified as the operative eye and it was prepped and draped in the usual sterile ophthalmic fashion.   A 1.0 millimeter clear-corneal paracentesis was made at the 10:30 position. 0.5 ml of preservative-free 1% lidocaine with epinephrine was injected into the anterior chamber.  The anterior chamber was filled with Healon 5 viscoelastic.  A 2.4 millimeter keratome was used to make a near-clear corneal incision at the 8:00 position.  A curvilinear capsulorrhexis was made with a cystotome and capsulorrhexis forceps.  Balanced salt solution was used to hydrodissect and hydrodelineate the nucleus.   Phacoemulsification was then used in stop and chop fashion to remove the lens nucleus and epinucleus.  The remaining cortex was then removed using the irrigation and aspiration  handpiece. Healon was then placed into the capsular bag to distend it for lens placement.  A lens was then injected into the capsular bag.  The remaining viscoelastic was aspirated.   Wounds were hydrated with balanced salt solution.  The anterior chamber was inflated to a physiologic pressure with balanced salt solution.   Intracameral vigamox 0.1 mL undiluted was injected into the eye and a drop placed onto the ocular surface.  No wound leaks were noted.  The patient was taken to the recovery room in stable condition without complications of anesthesia or surgery  Benay Pillow 01/14/2019, 1:07 PM

## 2019-01-14 NOTE — Anesthesia Postprocedure Evaluation (Signed)
Anesthesia Post Note  Patient: Amy Daniel  Procedure(s) Performed: CATARACT EXTRACTION PHACO AND INTRAOCULAR LENS PLACEMENT (IOC) RIGHT  00:52.0  9.19  17.5 (Right Eye)  Patient location during evaluation: PACU Anesthesia Type: MAC Level of consciousness: awake and alert and oriented Pain management: satisfactory to patient Vital Signs Assessment: post-procedure vital signs reviewed and stable Respiratory status: spontaneous breathing, nonlabored ventilation and respiratory function stable Cardiovascular status: blood pressure returned to baseline and stable Postop Assessment: Adequate PO intake and No signs of nausea or vomiting Anesthetic complications: no    Raliegh Ip

## 2019-06-13 ENCOUNTER — Ambulatory Visit: Payer: BLUE CROSS/BLUE SHIELD | Attending: Internal Medicine

## 2019-06-13 DIAGNOSIS — Z23 Encounter for immunization: Secondary | ICD-10-CM | POA: Insufficient documentation

## 2019-06-13 NOTE — Progress Notes (Signed)
   Covid-19 Vaccination Clinic  Name:  CHASE KNEBEL    MRN: [id number] DOB: Oct 29, 1955  06/13/2019  Ms. Gupton was observed post Covid-19 immunization for 15 minutes without incidence. She was provided with Vaccine Information Sheet and instruction to access the V-Safe system.   Ms. Alaimo was instructed to call 911 with any severe reactions post vaccine: Marland Kitchen Difficulty breathing  . Swelling of your face and throat  . A fast heartbeat  . A bad rash all over your body  . Dizziness and weakness    Immunizations Administered    Name Date Dose VIS Date Route   Pfizer COVID-19 Vaccine 06/13/2019 11:10 AM 0.3 mL 03/29/2019 Intramuscular   Manufacturer: [employer], [employer]   Lot: [id number]   NDC: [address]-4

## 2019-07-10 ENCOUNTER — Ambulatory Visit: Payer: BLUE CROSS/BLUE SHIELD | Attending: Internal Medicine

## 2019-07-10 DIAGNOSIS — Z23 Encounter for immunization: Secondary | ICD-10-CM

## 2019-07-10 NOTE — Progress Notes (Signed)
   Covid-19 Vaccination Clinic  Name:  CHERAL CAPPUCCI    MRN: [id number] DOB: [birth year]  07/10/2019  Ms. Polivka was observed post Covid-19 immunization for 15 minutes without incident. She was provided with Vaccine Information Sheet and instruction to access the V-Safe system.   Ms. Dimon was instructed to call 911 with any severe reactions post vaccine: Marland Kitchen Difficulty breathing  . Swelling of face and throat  . A fast heartbeat  . A bad rash all over body  . Dizziness and weakness   Immunizations Administered    Name Date Dose VIS Date Route   Pfizer COVID-19 Vaccine 07/10/2019  9:45 AM 0.3 mL 03/29/2019 Intramuscular   Manufacturer: [employer], [employer]   Lot: [id number]   NDC: [address]-9

## 2020-07-22 ENCOUNTER — Other Ambulatory Visit: Payer: Self-pay | Admitting: Internal Medicine

## 2020-07-22 DIAGNOSIS — Z1231 Encounter for screening mammogram for malignant neoplasm of breast: Secondary | ICD-10-CM

## 2020-08-31 LAB — COLOGUARD

## 2020-09-14 LAB — COLOGUARD: COLOGUARD: NEGATIVE

## 2021-12-17 ENCOUNTER — Encounter: Payer: Self-pay | Admitting: Ophthalmology

## 2021-12-23 NOTE — Discharge Instructions (Signed)

## 2021-12-27 ENCOUNTER — Ambulatory Visit: Payer: No Typology Code available for payment source | Admitting: Anesthesiology

## 2021-12-27 ENCOUNTER — Other Ambulatory Visit: Payer: Self-pay

## 2021-12-27 ENCOUNTER — Encounter: Payer: Self-pay | Admitting: Ophthalmology

## 2021-12-27 ENCOUNTER — Ambulatory Visit
Admission: RE | Admit: 2021-12-27 | Discharge: 2021-12-27 | Disposition: A | Discharge status: Home / Self Care | Payer: No Typology Code available for payment source | Source: Ambulatory Visit | Attending: Ophthalmology | Admitting: Ophthalmology

## 2021-12-27 ENCOUNTER — Encounter
Admission: RE | Disposition: A | Discharge status: Home / Self Care | Payer: Self-pay | Source: Ambulatory Visit | Attending: Ophthalmology

## 2021-12-27 DIAGNOSIS — H2512 Age-related nuclear cataract, left eye: Secondary | ICD-10-CM | POA: Insufficient documentation

## 2021-12-27 HISTORY — PX: CATARACT EXTRACTION W/PHACO: SHX586

## 2021-12-27 SURGERY — PHACOEMULSIFICATION, CATARACT, WITH IOL INSERTION
Anesthesia: Topical | Site: Eye | Laterality: Left

## 2021-12-27 MED ORDER — LIDOCAINE HCL (PF) 2 % IJ SOLN
INTRAOCULAR | Status: DC | PRN
  Administered 2021-12-27: 1 mL via INTRAOCULAR

## 2021-12-27 MED ORDER — ARMC OPHTHALMIC DILATING DROPS
1.0000 | OPHTHALMIC | Status: DC | PRN
  Administered 2021-12-27 (×3): 1 via OPHTHALMIC

## 2021-12-27 MED ORDER — SIGHTPATH DOSE#1 NA HYALUR & NA CHOND-NA HYALUR IO KIT
PACK | INTRAOCULAR | Status: DC | PRN
  Administered 2021-12-27: 1 via OPHTHALMIC

## 2021-12-27 MED ORDER — FENTANYL CITRATE (PF) 100 MCG/2ML IJ SOLN
INTRAMUSCULAR | Status: DC | PRN
  Administered 2021-12-27: 50 ug via INTRAVENOUS

## 2021-12-27 MED ORDER — MIDAZOLAM HCL 2 MG/2ML IJ SOLN
INTRAMUSCULAR | Status: DC | PRN
  Administered 2021-12-27: 1 mg via INTRAVENOUS

## 2021-12-27 MED ORDER — SIGHTPATH DOSE#1 BSS IO SOLN
INTRAOCULAR | Status: DC | PRN
  Administered 2021-12-27: 15 mL

## 2021-12-27 MED ORDER — TETRACAINE HCL 0.5 % OP SOLN
1.0000 [drp] | OPHTHALMIC | Status: DC | PRN
  Administered 2021-12-27 (×3): 1 [drp] via OPHTHALMIC

## 2021-12-27 MED ORDER — MOXIFLOXACIN HCL 0.5 % OP SOLN
OPHTHALMIC | Status: DC | PRN
  Administered 2021-12-27: 0.2 mL via OPHTHALMIC

## 2021-12-27 MED ORDER — SIGHTPATH DOSE#1 BSS IO SOLN
INTRAOCULAR | Status: DC | PRN
  Administered 2021-12-27: 66 mL via OPHTHALMIC

## 2021-12-27 SURGICAL SUPPLY — 14 items
CATARACT SUITE SIGHTPATH (MISCELLANEOUS) ×1 IMPLANT
DISSECTOR HYDRO NUCLEUS 50X22 (MISCELLANEOUS) ×1 IMPLANT
FEE CATARACT SUITE SIGHTPATH (MISCELLANEOUS) ×1 IMPLANT
GLOVE SURG GAMMEX PI TX LF 7.5 (GLOVE) ×1 IMPLANT
GLOVE SURG SYN 8.5  E (GLOVE) ×1
GLOVE SURG SYN 8.5 E (GLOVE) ×1 IMPLANT
GLOVE SURG SYN 8.5 PF PI (GLOVE) ×1 IMPLANT
LENS IOL TECNIS EYHANCE 21.5 (Intraocular Lens) IMPLANT
NDL FILTER BLUNT 18X1 1/2 (NEEDLE) ×1 IMPLANT
NEEDLE FILTER BLUNT 18X 1/2SAF (NEEDLE) ×1
NEEDLE FILTER BLUNT 18X1 1/2 (NEEDLE) ×1 IMPLANT
SYR 3ML LL SCALE MARK (SYRINGE) ×1 IMPLANT
SYR 5ML LL (SYRINGE) ×1 IMPLANT
WATER STERILE IRR 250ML POUR (IV SOLUTION) ×1 IMPLANT

## 2021-12-27 NOTE — Op Note (Signed)
OPERATIVE NOTE  RAYMA HEGG [id number] 12/27/2021   PREOPERATIVE DIAGNOSIS:  Nuclear sclerotic cataract left eye.  H25.12   POSTOPERATIVE DIAGNOSIS:    Nuclear sclerotic cataract left eye.     PROCEDURE:  Phacoemusification with posterior chamber intraocular lens placement of the left eye   LENS:   Implant Name Type Inv. Item Serial No. Manufacturer Lot No. LRB No. Used Action  LENS IOL TECNIS EYHANCE 21.5 - [id number] Intraocular Lens LENS IOL TECNIS EYHANCE 21.5 [id number] [employer]  Left 1 Implanted      Procedure(s): CATARACT EXTRACTION PHACO AND INTRAOCULAR LENS PLACEMENT (IOC) LEFT 4.36 00:34.1 (Left)  DIB00 +21.5   ULTRASOUND TIME: 0 minutes 34 seconds.  CDE 4.36   SURGEON:  Willey Blade, MD, MPH   ANESTHESIA:  Topical with tetracaine drops augmented with 1% preservative-free intracameral lidocaine.  ESTIMATED BLOOD LOSS: <1 mL   COMPLICATIONS:  None.   DESCRIPTION OF PROCEDURE:  The patient was identified in the holding room and transported to the operating room and placed in the supine position under the operating microscope.  The left eye was identified as the operative eye and it was prepped and draped in the usual sterile ophthalmic fashion.   A 1.0 millimeter clear-corneal paracentesis was made at the 5:00 position. 0.5 ml of preservative-free 1% lidocaine with epinephrine was injected into the anterior chamber.  The anterior chamber was filled with viscoelastic.  A 2.4 millimeter keratome was used to make a near-clear corneal incision at the 2:00 position.  A curvilinear capsulorrhexis was made with a cystotome and capsulorrhexis forceps.  Balanced salt solution was used to hydrodissect and hydrodelineate the nucleus.   Phacoemulsification was then used in stop and chop fashion to remove the lens nucleus and epinucleus.  The remaining cortex was then removed using the irrigation and aspiration handpiece. Viscoelastic was then placed into the capsular bag to  distend it for lens placement.  A lens was then injected into the capsular bag.  The remaining viscoelastic was aspirated.   Wounds were hydrated with balanced salt solution.  The anterior chamber was inflated to a physiologic pressure with balanced salt solution.  Intracameral vigamox 0.1 mL undiltued was injected into the eye and a drop placed onto the ocular surface.  No wound leaks were noted.  The patient was taken to the recovery room in stable condition without complications of anesthesia or surgery  Willey Blade 12/27/2021, 1:42 PM

## 2021-12-27 NOTE — H&P (Signed)
[facility]   Primary Care Physician:  Miki Kins, FNP Ophthalmologist: Dr. Willey Blade  Pre-Procedure History & Physical: HPI:  Amy Daniel is a 66 y.o. female here for cataract surgery.   Past Medical History:  Diagnosis Date   Acid reflux    Diverticulitis    Migraines    none for over 20 yrs   Vertigo     Past Surgical History:  Procedure Laterality Date   CATARACT EXTRACTION W/PHACO Right 01/14/2019   Procedure: CATARACT EXTRACTION PHACO AND INTRAOCULAR LENS PLACEMENT (IOC) RIGHT  00:52.0  9.19  17.5;  Surgeon: Nevada Crane, MD;  Location: [facility];  Service: Ophthalmology;  Laterality: Right;   COLONOSCOPY     TUBAL LIGATION      Prior to Admission medications   Medication Sig Start Date End Date Taking? Authorizing Provider  esomeprazole (NEXIUM) 20 MG capsule Take 20 mg by mouth daily at 12 noon.   Yes [provider]  cimetidine (TAGAMET) 200 MG tablet Take 200 mg by mouth 2 (two) times daily. Patient not taking: Reported on 12/17/2021    [provider]  meclizine (ANTIVERT) 25 MG tablet Take 1 tablet (25 mg total) by mouth 3 (three) times daily as needed for dizziness. Patient not taking: Reported on 12/17/2021 03/29/17   Gabriel Cirri, NP    Allergies as of 11/22/2021   (No Known Allergies)    Family History  Problem Relation Age of Onset   Cancer Mother        breast   AAA (abdominal aortic aneurysm) Father    Diabetes Brother    Heart attack Maternal Grandfather    Emphysema Maternal Grandfather     Social History   Socioeconomic History   Marital status: Married    Spouse name: Not on file   Number of children: Not on file   Years of education: Not on file   Highest education level: Not on file  Occupational History   Not on file  Tobacco Use   Smoking status: Former    Packs/day: 1.00    Years: 46.00    Total pack years: 46.00    Types: Cigarettes    Quit date: 2022    Years since  quitting: 1.6   Smokeless tobacco: Never   Tobacco comments:    since age 76  Vaping Use   Vaping Use: Never used  Substance and Sexual Activity   Alcohol use: No   Drug use: No   Sexual activity: Yes  Other Topics Concern   Not on file  Social History Narrative   Not on file   Social Determinants of Health   Financial Resource Strain: Not on file  Food Insecurity: Not on file  Transportation Needs: Not on file  Physical Activity: Not on file  Stress: Not on file  Social Connections: Not on file  Intimate Partner Violence: Not on file    Review of Systems: See HPI, otherwise negative ROS  Physical Exam: BP (!) 171/88   Pulse (!) 57   Temp (!) 97.5 F (36.4 C) (Temporal)   Ht 5' 3.5" (1.613 m)   Wt 84.8 kg   LMP  (LMP Unknown)   SpO2 95%   BMI 32.59 kg/m  General:   Alert, cooperative in NAD Head:  Normocephalic and atraumatic. Respiratory:  Normal work of breathing. Cardiovascular:  RRR  Impression/Plan: Amy Daniel is here for cataract surgery.  Risks, benefits, limitations, and alternatives regarding cataract  surgery have been reviewed with the patient.  Questions have been answered.  All parties agreeable.   Willey Blade, MD  12/27/2021, 1:17 PM

## 2021-12-27 NOTE — Transfer of Care (Signed)
Immediate Anesthesia Transfer of Care Note  Patient: Amy Daniel  Procedure(s) Performed: CATARACT EXTRACTION PHACO AND INTRAOCULAR LENS PLACEMENT (IOC) LEFT 4.36 00:34.1 (Left: Eye)  Patient Location: PACU  Anesthesia Type: No value filed.  Level of Consciousness: awake, alert  and patient cooperative  Airway and Oxygen Therapy: Patient Spontanous Breathing and Patient connected to supplemental oxygen  Post-op Assessment: Post-op Vital signs reviewed, Patient's Cardiovascular Status Stable, Respiratory Function Stable, Patent Airway and No signs of Nausea or vomiting  Post-op Vital Signs: Reviewed and stable  Complications: No notable events documented.

## 2021-12-27 NOTE — Anesthesia Preprocedure Evaluation (Signed)
Anesthesia Evaluation  Patient identified by MRN, date of birth, ID band Patient awake    Reviewed: Allergy & Precautions, H&P , NPO status , Patient's Chart, lab work & pertinent test results, reviewed documented beta blocker date and time   Airway Mallampati: II  TM Distance: >3 FB Neck ROM: full    Dental no notable dental hx. (+) Teeth Intact   Pulmonary neg pulmonary ROS, former smoker,    Pulmonary exam normal breath sounds clear to auscultation       Cardiovascular Exercise Tolerance: Good hypertension, negative cardio ROS   Rhythm:regular Rate:Normal     Neuro/Psych  Headaches, negative neurological ROS  negative psych ROS   GI/Hepatic negative GI ROS, Neg liver ROS, GERD  Medicated,  Endo/Other  negative endocrine ROSdiabetes  Renal/GU      Musculoskeletal   Abdominal   Peds  Hematology negative hematology ROS (+)   Anesthesia Other Findings   Reproductive/Obstetrics negative OB ROS                             Anesthesia Physical Anesthesia Plan  ASA: 2  Anesthesia Plan: MAC   Post-op Pain Management:    Induction:   PONV Risk Score and Plan:   Airway Management Planned:   Additional Equipment:   Intra-op Plan:   Post-operative Plan:   Informed Consent: I have reviewed the patients History and Physical, chart, labs and discussed the procedure including the risks, benefits and alternatives for the proposed anesthesia with the patient or authorized representative who has indicated his/her understanding and acceptance.       Plan Discussed with: CRNA  Anesthesia Plan Comments:         Anesthesia Quick Evaluation

## 2021-12-27 NOTE — Anesthesia Postprocedure Evaluation (Signed)
Anesthesia Post Note  Patient: Amy Daniel  Procedure(s) Performed: CATARACT EXTRACTION PHACO AND INTRAOCULAR LENS PLACEMENT (IOC) LEFT 4.36 00:34.1 (Left: Eye)  Patient location during evaluation: PACU Anesthesia Type: MAC Level of consciousness: awake and alert Pain management: pain level controlled Vital Signs Assessment: post-procedure vital signs reviewed and stable Respiratory status: spontaneous breathing, nonlabored ventilation, respiratory function stable and patient connected to nasal cannula oxygen Cardiovascular status: stable and blood pressure returned to baseline Postop Assessment: no apparent nausea or vomiting Anesthetic complications: no   No notable events documented.   Last Vitals:  Vitals:   12/27/21 1343 12/27/21 1349  BP: (!) 145/87 (!) 145/92  Pulse: (!) 57 (!) 59  Resp: 18 16  Temp: (!) 36.4 C (!) 36.4 C  SpO2: 94% 95%    Last Pain:  Vitals:   12/27/21 1343  TempSrc:   PainSc: 0-No pain                 Molli Barrows

## 2021-12-28 ENCOUNTER — Encounter: Payer: Self-pay | Admitting: Ophthalmology

## 2022-06-02 ENCOUNTER — Ambulatory Visit (INDEPENDENT_AMBULATORY_CARE_PROVIDER_SITE_OTHER): Payer: Medicare HMO | Admitting: Family

## 2022-06-02 ENCOUNTER — Encounter: Payer: Self-pay | Admitting: Family

## 2022-06-02 VITALS — BP 132/88 | HR 67 | Ht 60.0 in | Wt 189.2 lb

## 2022-06-02 DIAGNOSIS — Z0000 Encounter for general adult medical examination without abnormal findings: Secondary | ICD-10-CM

## 2022-06-02 DIAGNOSIS — R635 Abnormal weight gain: Secondary | ICD-10-CM

## 2022-06-02 DIAGNOSIS — E559 Vitamin D deficiency, unspecified: Secondary | ICD-10-CM | POA: Diagnosis not present

## 2022-06-02 DIAGNOSIS — Z122 Encounter for screening for malignant neoplasm of respiratory organs: Secondary | ICD-10-CM | POA: Diagnosis not present

## 2022-06-02 DIAGNOSIS — E669 Obesity, unspecified: Secondary | ICD-10-CM | POA: Diagnosis not present

## 2022-06-02 DIAGNOSIS — R7303 Prediabetes: Secondary | ICD-10-CM

## 2022-06-02 DIAGNOSIS — E538 Deficiency of other specified B group vitamins: Secondary | ICD-10-CM

## 2022-06-02 DIAGNOSIS — E782 Mixed hyperlipidemia: Secondary | ICD-10-CM

## 2022-06-02 DIAGNOSIS — F17201 Nicotine dependence, unspecified, in remission: Secondary | ICD-10-CM

## 2022-06-02 DIAGNOSIS — Z1231 Encounter for screening mammogram for malignant neoplasm of breast: Secondary | ICD-10-CM | POA: Diagnosis not present

## 2022-06-02 DIAGNOSIS — R69 Illness, unspecified: Secondary | ICD-10-CM | POA: Diagnosis not present

## 2022-06-02 NOTE — Progress Notes (Signed)
Annual Wellness Visit     Patient: Amy Daniel, Female    DOB: May 10, 1955, 67 y.o.   MRN: [id number]  Subjective  Chief Complaint  Patient presents with   Follow-up    Annual Wellness Visit    Amy Daniel is a 67 y.o. female who presents today for her Annual Wellness Visit. She reports consuming a general diet. 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 She generally feels well. She reports sleeping well. She does not have additional problems to discuss today.   Patient here today for her Medicare annual wellness visit.  She has not had one of these previously, so we have discussed the initial wellness visit.        Vision:Within last year and Dental: No current dental problems   Patient Active Problem List   Diagnosis Date Noted   Obesity (BMI 30.0-34.9) 07/25/2016   Smoking 07/25/2016   Migraines    Past Medical History:  Diagnosis Date   Acid reflux    Diverticulitis    Migraines    none for over 20 yrs   Vertigo    Past Surgical History:  Procedure Laterality Date   CATARACT EXTRACTION W/PHACO Right 01/14/2019   Procedure: CATARACT EXTRACTION PHACO AND INTRAOCULAR LENS PLACEMENT (IOC) RIGHT  00:52.0  9.19  17.5;  Surgeon: Eulogio Bear, MD;  Location: [facility];  Service: Ophthalmology;  Laterality: Right;   CATARACT EXTRACTION W/PHACO Left 12/27/2021   Procedure: CATARACT EXTRACTION PHACO AND INTRAOCULAR LENS PLACEMENT (IOC) LEFT 4.36 00:34.1;  Surgeon: Eulogio Bear, MD;  Location: [facility];  Service: Ophthalmology;  Laterality: Left;   COLONOSCOPY     TUBAL LIGATION     Social History   Tobacco Use   Smoking status: Former    Packs/day: 1.00    Years: 46.00    Total pack years: 46.00    Types: Cigarettes    Quit date: 2022    Years since quitting: 2.1   Smokeless tobacco: Never   Tobacco comments:    since age 80  Vaping Use   Vaping Use: Never used  Substance Use Topics    Alcohol use: No   Drug use: No   Family History  Problem Relation Age of Onset   Cancer Mother        breast   AAA (abdominal aortic aneurysm) Father    Diabetes Brother    Heart attack Maternal Grandfather    Emphysema Maternal Grandfather    No Known Allergies    Medications: Outpatient Medications Prior to Visit  Medication Sig   esomeprazole (NEXIUM) 20 MG capsule Take 20 mg by mouth daily at 12 noon.   [DISCONTINUED] cimetidine (TAGAMET) 200 MG tablet Take 200 mg by mouth 2 (two) times daily. (Patient not taking: Reported on 12/17/2021)   [DISCONTINUED] meclizine (ANTIVERT) 25 MG tablet Take 1 tablet (25 mg total) by mouth 3 (three) times daily as needed for dizziness. (Patient not taking: Reported on 12/17/2021)   No facility-administered medications prior to visit.    No Known Allergies  Patient Care Team: Mechele Claude, FNP as PCP - General (Family Medicine)  Review of Systems  All other systems reviewed and are negative.       Objective  BP 132/88   Pulse 67   Ht 5' (1.524 m)   Wt 189 lb 3.2 oz (85.8 kg)   LMP  (LMP Unknown)   SpO2 96%   BMI 36.95  kg/m  BP Readings from Last 3 Encounters:  06/02/22 132/88  12/27/21 (!) 145/92  01/14/19 (!) 145/75   Wt Readings from Last 3 Encounters:  06/02/22 189 lb 3.2 oz (85.8 kg)  12/27/21 186 lb 14.4 oz (84.8 kg)  01/14/19 180 lb (81.6 kg)      Physical Exam Vitals and nursing note reviewed.  Constitutional:      Appearance: Normal appearance. She is normal weight.  HENT:     Head: Normocephalic.  Eyes:     Pupils: Pupils are equal, round, and reactive to light.  Cardiovascular:     Rate and Rhythm: Normal rate.  Pulmonary:     Effort: Pulmonary effort is normal.  Neurological:     Mental Status: She is alert.       Most recent functional status assessment:    06/02/2022   11:01 AM  In your present state of health, do you have any difficulty performing the following activities:  Hearing?  0  Difficulty concentrating or making decisions? 0  Walking or climbing stairs? 0  Dressing or bathing? 0  Doing errands, shopping? 0  Preparing Food and eating ? N  Using the Toilet? N  In the past six months, have you accidently leaked urine? N  Do you have problems with loss of bowel control? N  Managing your Medications? N  Managing your Finances? N  Housekeeping or managing your Housekeeping? N   Most recent fall risk assessment:    06/02/2022   11:01 AM  Fall Risk   Falls in the past year? 0  Number falls in past yr: 0  Injury with Fall? 0  Risk for fall due to : No Fall Risks    Most recent depression screenings:    06/02/2022   11:01 AM 07/25/2016    1:16 PM  PHQ 2/9 Scores  PHQ - 2 Score 0 0  PHQ- 9 Score  0   Most recent cognitive screening:    06/02/2022   11:03 AM  6CIT Screen  What Year? 0 points  What month? 0 points  What time? 0 points  Count back from 20 0 points  Months in reverse 0 points  Repeat phrase 4 points  Total Score 4 points   Most recent Audit-C alcohol use screening    06/02/2022   11:05 AM  Alcohol Use Disorder Test (AUDIT)  1. How often do you have a drink containing alcohol? 0  2. How many drinks containing alcohol do you have on a typical day when you are drinking? 0  3. How often do you have six or more drinks on one occasion? 0  AUDIT-C Score 0   A score of 3 or more in women, and 4 or more in men indicates increased risk for alcohol abuse, EXCEPT if all of the points are from question 1   Labs:   No results found for any visits on 06/02/22.    Assessment & Plan   Annual wellness visit done today including the all of the following: Reviewed patient's Family Medical History Reviewed and updated list of patient's medical providers Assessment of cognitive impairment was done Assessed patient's functional ability Established a written schedule for health screening [place] Completed and  Reviewed  Exercise Activities and Dietary recommendations  Goals       DIET - REDUCE CALORIE INTAKE      Weight (lb) < 160 lb (72.6 kg) (pt-stated)        Immunization  History  Administered Date(s) Administered   MMR 05/23/1994   PFIZER(Purple Top)SARS-COV-2 Vaccination 06/13/2019, 07/10/2019   Td 05/19/1994   Tdap 07/27/2017    Health Maintenance  Topic Date Due   HIV Screening  Never done   Hepatitis C Screening  Never done   Lung Cancer Screening  Never done   MAMMOGRAM  09/13/2006   Zoster Vaccines- Shingrix (1 of 2) Never done   Pneumonia Vaccine 13+ Years old (1 of 1 - PCV) Never done   DEXA SCAN  Never done   INFLUENZA VACCINE  Never done   COVID-19 Vaccine (3 - 2023-24 season) 12/17/2021   Medicare Annual Wellness (AWV)  06/03/2023   PAP SMEAR-Modifier  08/22/2023   Fecal DNA (Cologuard)  09/09/2023   DTaP/Tdap/Td (3 - Td or Tdap) 07/28/2027   HPV VACCINES  Aged Out   COLONOSCOPY (Pts 45-3yr Insurance coverage will need to be confirmed)  Discontinued     Discussed health benefits of physical activity, and encouraged her to engage in regular exercise appropriate for her age and condition.    Problem List Items Addressed This Visit     Obesity (BMI 30.0-34.9)   Other Visit Diagnoses     Encounter for initial annual wellness visit (AWV) in Medicare patient    -  Primary   Relevant Orders   CBC With Differential   CMP14+EGFR   Screening mammogram for breast cancer       Relevant Orders   MM 3D SCREEN BREAST BILATERAL   Screening for lung cancer       Relevant Orders   CT CHEST LUNG CANCER SCREENING LOW DOSE WO CONTRAST   Tobacco use disorder, mild, in sustained remission       Prediabetes       Relevant Orders   Hemoglobin A1c   Weight gain       Relevant Orders   TSH   B12 deficiency due to diet       Relevant Orders   Vitamin B12   Vitamin D deficiency       Relevant Orders   VITAMIN D 25 Hydroxy (Vit-D Deficiency, Fractures)   Mixed  hyperlipidemia       Relevant Orders   Lipid panel       Return in about 4 months (around 10/01/2022).     AMechele Claude FNP 06/02/2022

## 2022-06-03 LAB — CBC WITH DIFFERENTIAL
Basophils Absolute: 0.1 10*3/uL (ref 0.0–0.2)
Basos: 1 %
EOS (ABSOLUTE): 0.1 10*3/uL (ref 0.0–0.4)
Eos: 2 %
Hematocrit: 41.4 % (ref 34.0–46.6)
Hemoglobin: 14.2 g/dL (ref 11.1–15.9)
Immature Grans (Abs): 0.1 10*3/uL (ref 0.0–0.1)
Immature Granulocytes: 1 %
Lymphocytes Absolute: 2.1 10*3/uL (ref 0.7–3.1)
Lymphs: 35 %
MCH: 32.8 pg (ref 26.6–33.0)
MCHC: 34.3 g/dL (ref 31.5–35.7)
MCV: 96 fL (ref 79–97)
Monocytes Absolute: 0.3 10*3/uL (ref 0.1–0.9)
Monocytes: 5 %
Neutrophils Absolute: 3.5 10*3/uL (ref 1.4–7.0)
Neutrophils: 56 %
RBC: 4.33 x10E6/uL (ref 3.77–5.28)
RDW: 12.7 % (ref 11.7–15.4)
WBC: 6.2 10*3/uL (ref 3.4–10.8)

## 2022-06-03 LAB — LIPID PANEL
Chol/HDL Ratio: 5.2 ratio — ABNORMAL HIGH (ref 0.0–4.4)
Cholesterol, Total: 232 mg/dL — ABNORMAL HIGH (ref 100–199)
HDL: 45 mg/dL (ref >39)
LDL Chol Calc (NIH): 161 mg/dL — ABNORMAL HIGH (ref 0–99)
Triglycerides: 144 mg/dL (ref 0–149)
VLDL Cholesterol Cal: 26 mg/dL (ref 5–40)

## 2022-06-03 LAB — CMP14+EGFR
ALT: 25 IU/L (ref 0–32)
AST: 19 IU/L (ref 0–40)
Albumin/Globulin Ratio: 1.5 (ref 1.2–2.2)
Albumin: 4.5 g/dL (ref 3.9–4.9)
Alkaline Phosphatase: 68 IU/L (ref 44–121)
BUN/Creatinine Ratio: 11 — ABNORMAL LOW (ref 12–28)
BUN: 8 mg/dL (ref 8–27)
Bilirubin Total: 0.3 mg/dL (ref 0.0–1.2)
CO2: 24 mmol/L (ref 20–29)
Calcium: 9 mg/dL (ref 8.7–10.3)
Chloride: 101 mmol/L (ref 96–106)
Creatinine, Ser: 0.72 mg/dL (ref 0.57–1.00)
Globulin, Total: 3 g/dL (ref 1.5–4.5)
Glucose: 92 mg/dL (ref 70–99)
Potassium: 4.4 mmol/L (ref 3.5–5.2)
Sodium: 140 mmol/L (ref 134–144)
Total Protein: 7.5 g/dL (ref 6.0–8.5)
eGFR: 93 mL/min/{1.73_m2} (ref >59)

## 2022-06-03 LAB — VITAMIN D 25 HYDROXY (VIT D DEFICIENCY, FRACTURES): Vit D, 25-Hydroxy: 21.9 ng/mL — ABNORMAL LOW (ref 30.0–100.0)

## 2022-06-03 LAB — HEMOGLOBIN A1C
Est. average glucose Bld gHb Est-mCnc: 131 mg/dL
Hgb A1c MFr Bld: 6.2 % — ABNORMAL HIGH (ref 4.8–5.6)

## 2022-06-03 LAB — VITAMIN B12: Vitamin B-12: 253 pg/mL (ref 232–1245)

## 2022-06-03 LAB — TSH: TSH: 1.1 u[IU]/mL (ref 0.450–4.500)

## 2022-06-08 ENCOUNTER — Encounter: Payer: Self-pay | Admitting: Family

## 2022-06-09 ENCOUNTER — Ambulatory Visit: Payer: Medicare HMO

## 2022-06-09 ENCOUNTER — Ambulatory Visit (INDEPENDENT_AMBULATORY_CARE_PROVIDER_SITE_OTHER): Payer: Medicare HMO

## 2022-06-09 ENCOUNTER — Ambulatory Visit (INDEPENDENT_AMBULATORY_CARE_PROVIDER_SITE_OTHER): Payer: Medicare HMO | Admitting: Family

## 2022-06-09 ENCOUNTER — Encounter: Payer: Self-pay | Admitting: Family

## 2022-06-09 DIAGNOSIS — Z87891 Personal history of nicotine dependence: Secondary | ICD-10-CM

## 2022-06-09 DIAGNOSIS — E538 Deficiency of other specified B group vitamins: Secondary | ICD-10-CM | POA: Diagnosis not present

## 2022-06-09 DIAGNOSIS — Z122 Encounter for screening for malignant neoplasm of respiratory organs: Secondary | ICD-10-CM

## 2022-06-09 MED ORDER — CYANOCOBALAMIN 1000 MCG/ML IJ SOLN
1000.0000 ug | Freq: Once | INTRAMUSCULAR | Status: AC
  Administered 2022-06-09: 1000 ug via INTRAMUSCULAR

## 2022-06-09 NOTE — Progress Notes (Signed)
   Subjective  Amy Daniel DOB: 1956-02-26 MRN: [id number]  CHIEF COMPLAINT  B12 Shot     REASON FOR VISIT  B12 Injection     ASSESSMENT  B12 Deficiency, Unspecified     PLAN  Pt. given B12 injection in clinic.   Return for next injection per provider instructions.

## 2022-06-09 NOTE — Addendum Note (Signed)
Addended by: Georgian Co on: 06/09/2022 05:43 PM   Modules accepted: Level of Service

## 2022-08-20 DIAGNOSIS — Z833 Family history of diabetes mellitus: Secondary | ICD-10-CM | POA: Diagnosis not present

## 2022-08-20 DIAGNOSIS — I10 Essential (primary) hypertension: Secondary | ICD-10-CM | POA: Diagnosis not present

## 2022-08-20 DIAGNOSIS — Z87891 Personal history of nicotine dependence: Secondary | ICD-10-CM | POA: Diagnosis not present

## 2022-08-20 DIAGNOSIS — E669 Obesity, unspecified: Secondary | ICD-10-CM | POA: Diagnosis not present

## 2022-08-20 DIAGNOSIS — Z008 Encounter for other general examination: Secondary | ICD-10-CM | POA: Diagnosis not present

## 2022-08-20 DIAGNOSIS — Z803 Family history of malignant neoplasm of breast: Secondary | ICD-10-CM | POA: Diagnosis not present

## 2022-08-20 DIAGNOSIS — Z6833 Body mass index (BMI) 33.0-33.9, adult: Secondary | ICD-10-CM | POA: Diagnosis not present

## 2022-08-20 DIAGNOSIS — E785 Hyperlipidemia, unspecified: Secondary | ICD-10-CM | POA: Diagnosis not present

## 2022-09-22 DIAGNOSIS — Z08 Encounter for follow-up examination after completed treatment for malignant neoplasm: Secondary | ICD-10-CM | POA: Diagnosis not present

## 2022-09-22 DIAGNOSIS — L570 Actinic keratosis: Secondary | ICD-10-CM | POA: Diagnosis not present

## 2022-09-22 DIAGNOSIS — L578 Other skin changes due to chronic exposure to nonionizing radiation: Secondary | ICD-10-CM | POA: Diagnosis not present

## 2022-09-22 DIAGNOSIS — Z85828 Personal history of other malignant neoplasm of skin: Secondary | ICD-10-CM | POA: Diagnosis not present

## 2022-10-05 ENCOUNTER — Ambulatory Visit: Payer: Medicare HMO | Admitting: Family

## 2023-01-11 DIAGNOSIS — H209 Unspecified iridocyclitis: Secondary | ICD-10-CM | POA: Diagnosis not present

## 2023-01-11 DIAGNOSIS — Z961 Presence of intraocular lens: Secondary | ICD-10-CM | POA: Diagnosis not present

## 2023-01-11 DIAGNOSIS — H43813 Vitreous degeneration, bilateral: Secondary | ICD-10-CM | POA: Diagnosis not present

## 2023-01-11 DIAGNOSIS — H04123 Dry eye syndrome of bilateral lacrimal glands: Secondary | ICD-10-CM | POA: Diagnosis not present

## 2023-01-26 DIAGNOSIS — H04123 Dry eye syndrome of bilateral lacrimal glands: Secondary | ICD-10-CM | POA: Diagnosis not present

## 2023-01-26 DIAGNOSIS — Z961 Presence of intraocular lens: Secondary | ICD-10-CM | POA: Diagnosis not present

## 2023-01-26 DIAGNOSIS — H209 Unspecified iridocyclitis: Secondary | ICD-10-CM | POA: Diagnosis not present

## 2023-03-23 DIAGNOSIS — Z961 Presence of intraocular lens: Secondary | ICD-10-CM | POA: Diagnosis not present

## 2023-03-23 DIAGNOSIS — H59039 Cystoid macular edema following cataract surgery, unspecified eye: Secondary | ICD-10-CM | POA: Diagnosis not present

## 2023-03-23 DIAGNOSIS — H35352 Cystoid macular degeneration, left eye: Secondary | ICD-10-CM | POA: Diagnosis not present

## 2023-04-13 DIAGNOSIS — H35352 Cystoid macular degeneration, left eye: Secondary | ICD-10-CM | POA: Diagnosis not present

## 2023-04-13 DIAGNOSIS — H59039 Cystoid macular edema following cataract surgery, unspecified eye: Secondary | ICD-10-CM | POA: Diagnosis not present

## 2023-04-13 DIAGNOSIS — Z961 Presence of intraocular lens: Secondary | ICD-10-CM | POA: Diagnosis not present

## 2024-01-22 ENCOUNTER — Other Ambulatory Visit

## 2024-01-22 DIAGNOSIS — R635 Abnormal weight gain: Secondary | ICD-10-CM

## 2024-01-22 DIAGNOSIS — Z0000 Encounter for general adult medical examination without abnormal findings: Secondary | ICD-10-CM

## 2024-01-22 DIAGNOSIS — E559 Vitamin D deficiency, unspecified: Secondary | ICD-10-CM

## 2024-01-22 DIAGNOSIS — E782 Mixed hyperlipidemia: Secondary | ICD-10-CM

## 2024-01-22 DIAGNOSIS — E538 Deficiency of other specified B group vitamins: Secondary | ICD-10-CM

## 2024-01-22 DIAGNOSIS — R7303 Prediabetes: Secondary | ICD-10-CM

## 2024-01-23 ENCOUNTER — Ambulatory Visit: Payer: Self-pay

## 2024-01-23 LAB — VITAMIN D 25 HYDROXY (VIT D DEFICIENCY, FRACTURES): Vit D, 25-Hydroxy: 38.4 ng/mL (ref 30.0–100.0)

## 2024-01-23 LAB — CBC WITH DIFFERENTIAL/PLATELET
Basophils Absolute: 0.1 x10E3/uL (ref 0.0–0.2)
Basos: 1 %
EOS (ABSOLUTE): 0.1 x10E3/uL (ref 0.0–0.4)
Eos: 3 %
Hematocrit: 30.8 % — ABNORMAL LOW (ref 34.0–46.6)
Hemoglobin: 9.6 g/dL — ABNORMAL LOW (ref 11.1–15.9)
Immature Grans (Abs): 0 x10E3/uL (ref 0.0–0.1)
Immature Granulocytes: 0 %
Lymphocytes Absolute: 1.4 x10E3/uL (ref 0.7–3.1)
Lymphs: 25 %
MCH: 29 pg (ref 26.6–33.0)
MCHC: 31.2 g/dL — ABNORMAL LOW (ref 31.5–35.7)
MCV: 93 fL (ref 79–97)
Monocytes Absolute: 0.4 x10E3/uL (ref 0.1–0.9)
Monocytes: 7 %
Neutrophils Absolute: 3.5 x10E3/uL (ref 1.4–7.0)
Neutrophils: 64 %
Platelets: 330 x10E3/uL (ref 150–450)
RBC: 3.31 x10E6/uL — ABNORMAL LOW (ref 3.77–5.28)
RDW: 13.4 % (ref 11.7–15.4)
WBC: 5.5 x10E3/uL (ref 3.4–10.8)

## 2024-01-23 LAB — CMP14+EGFR
ALT: 17 IU/L (ref 0–32)
AST: 12 IU/L (ref 0–40)
Albumin: 4.4 g/dL (ref 3.9–4.9)
Alkaline Phosphatase: 74 IU/L (ref 49–135)
BUN/Creatinine Ratio: 34 — ABNORMAL HIGH (ref 12–28)
BUN: 19 mg/dL (ref 8–27)
Bilirubin Total: <0.2 mg/dL (ref 0.0–1.2)
CO2: 22 mmol/L (ref 20–29)
Calcium: 9.7 mg/dL (ref 8.7–10.3)
Chloride: 105 mmol/L (ref 96–106)
Creatinine, Ser: 0.56 mg/dL — ABNORMAL LOW (ref 0.57–1.00)
Globulin, Total: 1.8 g/dL (ref 1.5–4.5)
Glucose: 155 mg/dL — ABNORMAL HIGH (ref 70–99)
Potassium: 4 mmol/L (ref 3.5–5.2)
Sodium: 142 mmol/L (ref 134–144)
Total Protein: 6.2 g/dL (ref 6.0–8.5)
eGFR: 100 mL/min/1.73 (ref >59)

## 2024-01-23 LAB — VITAMIN B12: Vitamin B-12: 291 pg/mL (ref 232–1245)

## 2024-01-23 LAB — LIPID PANEL
Chol/HDL Ratio: 2.2 ratio (ref 0.0–4.4)
Cholesterol, Total: 118 mg/dL (ref 100–199)
HDL: 54 mg/dL (ref >39)
LDL Chol Calc (NIH): 45 mg/dL (ref 0–99)
Triglycerides: 101 mg/dL (ref 0–149)
VLDL Cholesterol Cal: 19 mg/dL (ref 5–40)

## 2024-01-23 LAB — HEMOGLOBIN A1C
Est. average glucose Bld gHb Est-mCnc: 148 mg/dL
Hgb A1c MFr Bld: 6.8 % — ABNORMAL HIGH (ref 4.8–5.6)

## 2024-01-23 LAB — TSH: TSH: 0.863 u[IU]/mL (ref 0.450–4.500)

## 2024-01-24 ENCOUNTER — Other Ambulatory Visit: Payer: Self-pay

## 2024-01-24 ENCOUNTER — Ambulatory Visit (INDEPENDENT_AMBULATORY_CARE_PROVIDER_SITE_OTHER): Admitting: Family

## 2024-01-24 ENCOUNTER — Encounter: Payer: Self-pay | Admitting: Family

## 2024-01-24 VITALS — BP 130/78 | HR 72 | Ht 63.0 in | Wt 186.2 lb

## 2024-01-24 DIAGNOSIS — Z122 Encounter for screening for malignant neoplasm of respiratory organs: Secondary | ICD-10-CM | POA: Insufficient documentation

## 2024-01-24 DIAGNOSIS — Z1231 Encounter for screening mammogram for malignant neoplasm of breast: Secondary | ICD-10-CM | POA: Insufficient documentation

## 2024-01-24 DIAGNOSIS — E1165 Type 2 diabetes mellitus with hyperglycemia: Secondary | ICD-10-CM | POA: Diagnosis not present

## 2024-01-24 DIAGNOSIS — E782 Mixed hyperlipidemia: Secondary | ICD-10-CM | POA: Diagnosis not present

## 2024-01-24 DIAGNOSIS — Z0130 Encounter for examination of blood pressure without abnormal findings: Secondary | ICD-10-CM

## 2024-01-24 DIAGNOSIS — D649 Anemia, unspecified: Secondary | ICD-10-CM | POA: Diagnosis not present

## 2024-01-24 DIAGNOSIS — E66811 Obesity, class 1: Secondary | ICD-10-CM

## 2024-01-24 LAB — POC CREATINE & ALBUMIN,URINE
Creatinine, POC: 200 mg/dL
Microalbumin Ur, POC: 80 mg/L

## 2024-01-24 MED ORDER — SEMAGLUTIDE(0.25 OR 0.5MG/DOS) 2 MG/3ML ~~LOC~~ SOPN: 0.2500 mg | PEN_INJECTOR | SUBCUTANEOUS | 1 refills | Status: DC

## 2024-01-24 NOTE — Progress Notes (Signed)
 Established Patient Office Visit  Subjective:  Patient ID: Amy Daniel, female    DOB: 1955/08/22  Age: 68 y.o. MRN: [id number]  Chief Complaint  Patient presents with   Annual Exam    Yearly checkup and labs    Patient is here today for her one year follow up.  She has been feeling fairly well since last appointment.   She does have additional concerns to discuss today. She is concerned about her lab results. Will check iron studies to follow up on abnormal CBC. Will also provide patient with hemoccult stool cards to complete and return to office once completed. Patient is due for colon cancer screening. She would prefer cologuard but will wait on stool card results before determining if she needs GI referral for colonoscopy.  Also reports that she is having symptoms of bladder weakness. Recommend pelvis floor PT exercises at this time before further evaluation. Labs were done prior to appointment, will discuss in detail today. Her HbgA1c was 6.8% indicating diabetes. Patient will work on her diet and exercise as tolerated. Will collect UAC today. She needs refills.   I have reviewed her active problem list, medication list, allergies, family history, social history, health maintenance, notes from last encounter, lab results for her appointment today.    Patient endorses she is still tobacco free for 3 years now. She had her last low dose chest CT 05/2022 and is due for one. Will order to be completed. She is up to date on her eye exam.     No other concerns at this time.   Past Medical History:  Diagnosis Date   Acid reflux    Diverticulitis    Migraines    none for over 20 yrs   Vertigo     Past Surgical History:  Procedure Laterality Date   CATARACT EXTRACTION W/PHACO Right 01/14/2019   Procedure: CATARACT EXTRACTION PHACO AND INTRAOCULAR LENS PLACEMENT (IOC) RIGHT  00:52.0  9.19  17.5;  Surgeon: Myrna Adine Anes, MD;  Location: [facility];  Service:  Ophthalmology;  Laterality: Right;   CATARACT EXTRACTION W/PHACO Left 12/27/2021   Procedure: CATARACT EXTRACTION PHACO AND INTRAOCULAR LENS PLACEMENT (IOC) LEFT 4.36 00:34.1;  Surgeon: Myrna Adine Anes, MD;  Location: [facility];  Service: Ophthalmology;  Laterality: Left;   COLONOSCOPY     TUBAL LIGATION      Social History   Socioeconomic History   Marital status: Married    Spouse name: Not on file   Number of children: Not on file   Years of education: Not on file   Highest education level: Not on file  Occupational History   Not on file  Tobacco Use   Smoking status: Former    Current packs/day: 0.00    Average packs/day: 1 pack/day for 46.0 years (46.0 ttl pk-yrs)    Types: Cigarettes    Start date: 49    Quit date: 2022    Years since quitting: 3.7   Smokeless tobacco: Never   Tobacco comments:    since age 53  Vaping Use   Vaping status: Never Used  Substance and Sexual Activity   Alcohol use: No   Drug use: No   Sexual activity: Yes  Other Topics Concern   Not on file  Social History Narrative   Not on file   Social Drivers of Health   Financial Resource Strain: Not on file  Food Insecurity: Not on file  Transportation Needs: Not on file  Physical Activity: Not on file  Stress: Not on file  Social Connections: Not on file  Intimate Partner Violence: Not on file    Family History  Problem Relation Age of Onset   Cancer Mother        breast   AAA (abdominal aortic aneurysm) Father    Diabetes Brother    Heart attack Maternal Grandfather    Emphysema Maternal Grandfather     No Known Allergies  Review of Systems  Constitutional:  Negative for malaise/fatigue.  HENT: Negative.    Eyes:  Negative for blurred vision and pain.  Respiratory:  Negative for cough and shortness of breath.   Cardiovascular:  Negative for chest pain, palpitations, claudication and leg swelling.  Gastrointestinal:  Negative for abdominal pain, blood in  stool, constipation, diarrhea, nausea and vomiting.  Genitourinary:  Positive for urgency. Negative for dysuria and frequency.  Musculoskeletal: Negative.   Skin: Negative.   Neurological:  Negative for dizziness, tingling, sensory change and headaches.  Endo/Heme/Allergies: Negative.   Psychiatric/Behavioral: Negative.         Objective:   BP 130/78   Pulse 72   Ht 5' 3 (1.6 m)   Wt 186 lb 3.2 oz (84.5 kg)   LMP  (LMP Unknown)   SpO2 96%   BMI 32.98 kg/m   Vitals:   01/24/24 1059  BP: 130/78  Pulse: 72  Height: 5' 3 (1.6 m)  Weight: 186 lb 3.2 oz (84.5 kg)  SpO2: 96%  BMI (Calculated): 32.99    Physical Exam Vitals and nursing note reviewed.  Constitutional:      Appearance: Normal appearance.  HENT:     Head: Normocephalic.  Eyes:     Extraocular Movements: Extraocular movements intact.     Pupils: Pupils are equal, round, and reactive to light.  Cardiovascular:     Rate and Rhythm: Normal rate and regular rhythm.     Pulses: Normal pulses.     Heart sounds: Normal heart sounds. No murmur heard. Pulmonary:     Effort: Pulmonary effort is normal. No respiratory distress.     Breath sounds: Normal breath sounds.  Abdominal:     General: There is no distension.     Tenderness: There is no abdominal tenderness.  Musculoskeletal:        General: No tenderness. Normal range of motion.     Cervical back: Normal range of motion and neck supple.     Right lower leg: No edema.     Left lower leg: No edema.  Skin:    General: Skin is warm and dry.     Coloration: Skin is not jaundiced.     Findings: No erythema.  Neurological:     General: No focal deficit present.     Mental Status: She is alert and oriented to person, place, and time.  Psychiatric:        Mood and Affect: Mood normal.        Speech: Speech normal.        Behavior: Behavior is cooperative.        Cognition and Memory: Memory is not impaired.      No results found for any visits on  01/24/24.  Recent Results (from the past 2160 hours)  Vitamin B12     Status: None   Collection Time: 01/22/24 10:46 AM  Result Value Ref Range   Vitamin B-12 291 232 - 1,245 pg/mL  Hemoglobin A1c     Status: Abnormal  Collection Time: 01/22/24 10:46 AM  Result Value Ref Range   Hgb A1c MFr Bld 6.8 (H) 4.8 - 5.6 %    Comment:          Prediabetes: 5.7 - 6.4          Diabetes: >6.4          Glycemic control for adults with diabetes: <7.0    Est. average glucose Bld gHb Est-mCnc 148 mg/dL  TSH     Status: None   Collection Time: 01/22/24 10:46 AM  Result Value Ref Range   TSH 0.863 0.450 - 4.500 uIU/mL  CMP14+EGFR     Status: Abnormal   Collection Time: 01/22/24 10:46 AM  Result Value Ref Range   Glucose 155 (H) 70 - 99 mg/dL   BUN 19 8 - 27 mg/dL   Creatinine, Ser 9.43 (L) 0.57 - 1.00 mg/dL   eGFR 899 >40 fO/fpw/8.26   BUN/Creatinine Ratio 34 (H) 12 - 28   Sodium 142 134 - 144 mmol/L   Potassium 4.0 3.5 - 5.2 mmol/L   Chloride 105 96 - 106 mmol/L   CO2 22 20 - 29 mmol/L   Calcium 9.7 8.7 - 10.3 mg/dL   Total Protein 6.2 6.0 - 8.5 g/dL   Albumin 4.4 3.9 - 4.9 g/dL   Globulin, Total 1.8 1.5 - 4.5 g/dL   Bilirubin Total <9.7 0.0 - 1.2 mg/dL   Alkaline Phosphatase 74 49 - 135 IU/L   AST 12 0 - 40 IU/L   ALT 17 0 - 32 IU/L  VITAMIN D  25 Hydroxy (Vit-D Deficiency, Fractures)     Status: None   Collection Time: 01/22/24 10:46 AM  Result Value Ref Range   Vit D, 25-Hydroxy 38.4 30.0 - 100.0 ng/mL    Comment: Vitamin D  deficiency has been defined by the Institute of Medicine and an Endocrine Society practice guideline as a level of serum 25-OH vitamin D  less than 20 ng/mL (1,2). The Endocrine Society went on to further define vitamin D  insufficiency as a level between 21 and 29 ng/mL (2). 1. IOM (Institute of Medicine). 2010. Dietary reference    intakes for calcium and D. [place]: The    [employer]. 2. Holick MF, Binkley [place], Bischoff-Ferrari HA, et  al.    Evaluation, treatment, and prevention of vitamin D     deficiency: an Endocrine Society clinical practice    guideline. JCEM. 2011 Jul; 96(7):1911-30.   Lipid panel     Status: None   Collection Time: 01/22/24 10:46 AM  Result Value Ref Range   Cholesterol, Total 118 100 - 199 mg/dL   Triglycerides 898 0 - 149 mg/dL   HDL 54 >60 mg/dL   VLDL Cholesterol Cal 19 5 - 40 mg/dL   LDL Chol Calc (NIH) 45 0 - 99 mg/dL   Chol/HDL Ratio 2.2 0.0 - 4.4 ratio    Comment:                                   T. Chol/HDL Ratio                                             Men  Women  1/2 Avg.Risk  3.4    3.3                                   Avg.Risk  5.0    4.4                                2X Avg.Risk  9.6    7.1                                3X Avg.Risk 23.4   11.0   CBC with Diff     Status: Abnormal   Collection Time: 01/22/24 10:46 AM  Result Value Ref Range   WBC 5.5 3.4 - 10.8 x10E3/uL   RBC 3.31 (L) 3.77 - 5.28 x10E6/uL   Hemoglobin 9.6 (L) 11.1 - 15.9 g/dL   Hematocrit 69.1 (L) 65.9 - 46.6 %   MCV 93 79 - 97 fL   MCH 29.0 26.6 - 33.0 pg   MCHC 31.2 (L) 31.5 - 35.7 g/dL   RDW 86.5 88.2 - 84.5 %   Platelets 330 150 - 450 x10E3/uL   Neutrophils 64 Not Estab. %   Lymphs 25 Not Estab. %   Monocytes 7 Not Estab. %   Eos 3 Not Estab. %   Basos 1 Not Estab. %   Neutrophils Absolute 3.5 1.4 - 7.0 x10E3/uL   Lymphocytes Absolute 1.4 0.7 - 3.1 x10E3/uL   Monocytes Absolute 0.4 0.1 - 0.9 x10E3/uL   EOS (ABSOLUTE) 0.1 0.0 - 0.4 x10E3/uL   Basophils Absolute 0.1 0.0 - 0.2 x10E3/uL   Immature Granulocytes 0 Not Estab. %   Immature Grans (Abs) 0.0 0.0 - 0.1 x10E3/uL       Assessment & Plan Screening mammogram for breast cancer Screening for lung cancer - Referrals placed for routine screening prevention.  Anemia, unspecified type - Stool cards given. - Iron studies to be collected today will FU with patient on results. - patient due for colon cancer  screening.   Type 2 diabetes mellitus with hyperglycemia, without long-term current use of insulin (HCC) Obesity (BMI 30.0-34.9) Mixed hyperlipidemia - Patient is now diabetic. - Continue taking medications as prescribed. - Reinforced healthy diet and exercise. - Eye exam up to date. - Will try to get Ozmepic 0.25 mg weekly injections covered for glycemic control and aid in weight loss.   Return in about 3 months (around 04/25/2024).   Total time spent: 25 minutes  Oddis DELENA Cain, FNP  01/24/2024   This document may have been prepared by [facility] Voice Recognition software and as such may include unintentional dictation errors.

## 2024-01-24 NOTE — Assessment & Plan Note (Addendum)
-   Referrals placed for routine screening prevention.

## 2024-01-24 NOTE — Assessment & Plan Note (Addendum)
-   Patient is now diabetic. - Continue taking medications as prescribed. - Reinforced healthy diet and exercise. - Eye exam up to date. - Will try to get Ozmepic 0.25 mg weekly injections covered for glycemic control and aid in weight loss.

## 2024-01-24 NOTE — Assessment & Plan Note (Addendum)
-   Stool cards given. - Iron studies to be collected today will FU with patient on results. - patient due for colon cancer screening.

## 2024-01-24 NOTE — Patient Instructions (Signed)
 [facility] [address], [address] [place],  [place]  [address]  Main: [phone]

## 2024-01-25 LAB — IRON,TIBC AND FERRITIN PANEL
Ferritin: 248 ng/mL — ABNORMAL HIGH (ref 15–150)
Iron Saturation: 25 % (ref 15–55)
Iron: 83 ug/dL (ref 27–139)
Total Iron Binding Capacity: 334 ug/dL (ref 250–450)
UIBC: 251 ug/dL (ref 118–369)

## 2024-01-26 ENCOUNTER — Other Ambulatory Visit (INDEPENDENT_AMBULATORY_CARE_PROVIDER_SITE_OTHER): Payer: Self-pay

## 2024-01-26 DIAGNOSIS — D649 Anemia, unspecified: Secondary | ICD-10-CM | POA: Diagnosis not present

## 2024-01-26 LAB — POC HEMOCCULT BLD/STL (HOME/3-CARD/SCREEN)
Card #2 Fecal Occult Blod, POC: POSITIVE
Card #3 Fecal Occult Blood, POC: POSITIVE
Fecal Occult Blood, POC: POSITIVE — AB

## 2024-02-08 ENCOUNTER — Ambulatory Visit: Payer: Self-pay | Admitting: Family

## 2024-02-08 ENCOUNTER — Ambulatory Visit
Admission: RE | Admit: 2024-02-08 | Discharge: 2024-02-08 | Disposition: A | Discharge status: Home / Self Care | Source: Ambulatory Visit

## 2024-02-08 DIAGNOSIS — Z1231 Encounter for screening mammogram for malignant neoplasm of breast: Secondary | ICD-10-CM | POA: Diagnosis present

## 2024-02-13 ENCOUNTER — Other Ambulatory Visit

## 2024-02-14 ENCOUNTER — Ambulatory Visit: Admission: RE | Admit: 2024-02-14 | Discharge: 2024-02-14 | Disposition: A | Source: Ambulatory Visit

## 2024-02-14 ENCOUNTER — Other Ambulatory Visit

## 2024-02-14 DIAGNOSIS — Z122 Encounter for screening for malignant neoplasm of respiratory organs: Secondary | ICD-10-CM

## 2024-02-19 ENCOUNTER — Ambulatory Visit: Payer: Self-pay

## 2024-02-19 NOTE — Progress Notes (Signed)
 Patient notified

## 2024-02-20 ENCOUNTER — Other Ambulatory Visit: Payer: Self-pay

## 2024-02-20 DIAGNOSIS — R195 Other fecal abnormalities: Secondary | ICD-10-CM

## 2024-02-22 ENCOUNTER — Other Ambulatory Visit: Payer: Self-pay | Admitting: Family

## 2024-02-22 DIAGNOSIS — E1165 Type 2 diabetes mellitus with hyperglycemia: Secondary | ICD-10-CM

## 2024-02-22 MED ORDER — OZEMPIC (0.25 OR 0.5 MG/DOSE) 2 MG/3ML ~~LOC~~ SOPN: 0.5000 mg | PEN_INJECTOR | SUBCUTANEOUS | 1 refills | Status: AC

## 2024-04-25 ENCOUNTER — Ambulatory Visit: Admitting: Family

## 2024-04-25 ENCOUNTER — Encounter: Payer: Self-pay | Admitting: Family

## 2024-05-24 ENCOUNTER — Encounter

## 2024-07-24 ENCOUNTER — Ambulatory Visit: Admitting: Family
